# Patient Record
Sex: Male | Born: 1955 | Race: Black or African American | Hispanic: No | Marital: Single | Smoking: Never smoker
Health system: Southern US, Community
[De-identification: ages and names within clinical notes are randomized; demographics above are authoritative.]

## PROBLEM LIST (undated history)

## (undated) DIAGNOSIS — N200 Calculus of kidney: Secondary | ICD-10-CM

## (undated) DIAGNOSIS — N183 Chronic kidney disease, stage 3 unspecified: Secondary | ICD-10-CM

## (undated) DIAGNOSIS — I5030 Unspecified diastolic (congestive) heart failure: Secondary | ICD-10-CM

## (undated) DIAGNOSIS — I10 Essential (primary) hypertension: Secondary | ICD-10-CM

## (undated) HISTORY — DX: Calculus of kidney: N20.0

## (undated) HISTORY — DX: Essential (primary) hypertension: I10

---

## 2017-11-26 ENCOUNTER — Emergency Department
Admission: EM | Admit: 2017-11-26 | Discharge: 2017-11-26 | Disposition: A | Discharge status: Home / Self Care | Payer: BLUE CROSS/BLUE SHIELD | Attending: Emergency Medicine | Admitting: Emergency Medicine

## 2017-11-26 DIAGNOSIS — R040 Epistaxis: Secondary | ICD-10-CM | POA: Insufficient documentation

## 2017-11-26 LAB — CBC AND DIFFERENTIAL
Absolute NRBC: 0 10*3/uL (ref 0.00–0.00)
Basophils Absolute Automated: 0.04 10*3/uL (ref 0.00–0.08)
Basophils Automated: 0.5 %
Eosinophils Absolute Automated: 0.07 10*3/uL (ref 0.00–0.44)
Eosinophils Automated: 0.8 %
Hematocrit: 36.2 % — ABNORMAL LOW (ref 37.6–49.6)
Hgb: 12.4 g/dL — ABNORMAL LOW (ref 12.5–17.1)
Immature Granulocytes Absolute: 0.05 10*3/uL (ref 0.00–0.07)
Immature Granulocytes: 0.6 %
Lymphocytes Absolute Automated: 1.09 10*3/uL (ref 0.42–3.22)
Lymphocytes Automated: 12.5 %
MCH: 29.5 pg (ref 25.1–33.5)
MCHC: 34.3 g/dL (ref 31.5–35.8)
MCV: 86 fL (ref 78.0–96.0)
MPV: 11.2 fL (ref 8.9–12.5)
Monocytes Absolute Automated: 0.57 10*3/uL (ref 0.21–0.85)
Monocytes: 6.5 %
Neutrophils Absolute: 6.93 10*3/uL — ABNORMAL HIGH (ref 1.10–6.33)
Neutrophils: 79.1 %
Nucleated RBC: 0 /100 WBC (ref 0.0–0.0)
Platelets: 160 10*3/uL (ref 142–346)
RBC: 4.21 10*6/uL (ref 4.20–5.90)
RDW: 13 % (ref 11–15)
WBC: 8.75 10*3/uL (ref 3.10–9.50)

## 2017-11-26 LAB — COMPREHENSIVE METABOLIC PANEL
ALT: 30 U/L (ref 0–55)
AST (SGOT): 30 U/L (ref 5–34)
Albumin/Globulin Ratio: 1.4 (ref 0.9–2.2)
Albumin: 3.8 g/dL (ref 3.5–5.0)
Alkaline Phosphatase: 138 U/L — ABNORMAL HIGH (ref 38–106)
Anion Gap: 10 (ref 5.0–15.0)
BUN: 30 mg/dL — ABNORMAL HIGH (ref 9.0–28.0)
Bilirubin, Total: 0.8 mg/dL (ref 0.2–1.2)
CO2: 24 mEq/L (ref 22–29)
Calcium: 9 mg/dL (ref 8.5–10.5)
Chloride: 110 mEq/L (ref 100–111)
Creatinine: 2 mg/dL — ABNORMAL HIGH (ref 0.7–1.3)
Globulin: 2.7 g/dL (ref 2.0–3.6)
Glucose: 174 mg/dL — ABNORMAL HIGH (ref 70–100)
Potassium: 3.5 mEq/L (ref 3.5–5.1)
Protein, Total: 6.5 g/dL (ref 6.0–8.3)
Sodium: 144 mEq/L (ref 136–145)

## 2017-11-26 LAB — GFR: EGFR: 41.5

## 2017-11-26 MED ORDER — LIDOCAINE-EPINEPHRINE 1 %-1:200000 IJ SOLN
30.00 mL | Freq: Once | INTRAMUSCULAR | Status: AC
Start: 2017-11-26 — End: 2017-11-26

## 2017-11-26 MED ORDER — AMOXICILLIN-POT CLAVULANATE 875-125 MG PO TABS
1.00 | ORAL_TABLET | Freq: Two times a day (BID) | ORAL | 0 refills | Status: AC
Start: 2017-11-26 — End: 2017-12-03

## 2017-11-26 MED ORDER — OXYMETAZOLINE HCL 0.05 % NA SOLN
NASAL | Status: DC
Start: 2017-11-26 — End: 2017-11-26
  Administered 2017-11-26: 08:00:00 2 via NASAL
  Filled 2017-11-26: qty 15

## 2017-11-26 MED ORDER — OXYMETAZOLINE HCL 0.05 % NA SOLN
2.00 | Freq: Once | NASAL | Status: DC
Start: 2017-11-26 — End: 2017-11-26

## 2017-11-26 MED ORDER — SODIUM CHLORIDE 0.9 % IV BOLUS
1000.00 mL | Freq: Once | INTRAVENOUS | Status: AC
Start: 2017-11-26 — End: 2017-11-26
  Administered 2017-11-26: 08:00:00 1000 mL via INTRAVENOUS

## 2017-11-26 MED ORDER — LIDOCAINE-EPINEPHRINE 1 %-1:200000 IJ SOLN
INTRAMUSCULAR | Status: AC
Start: 2017-11-26 — End: 2017-11-26
  Administered 2017-11-26: 08:00:00 30 mL
  Filled 2017-11-26: qty 30

## 2017-11-26 NOTE — ED Notes (Signed)
Pt rang out on call bell, c/o feeling faint, spitting up bright red blood with clots. Pt diaphoretic, cool to touch. Dr. Merri Brunette to bedside. IV placed, labs drawn. Pt's mouth suctioned.

## 2017-11-26 NOTE — ED Provider Notes (Signed)
EMERGENCY DEPARTMENT HISTORY AND PHYSICAL EXAM     Physician/Midlevel provider first contact with patient: 11/26/17 0732         Date: 11/26/2017  Patient Name: Spencer Peters    History of Presenting Illness     Chief Complaint   Patient presents with   . Epistaxis       History Provided By: Pt    Chief Complaint: Epistaxis  Duration: This morning  Timing: Persistent  Location: b/l nares  Quality:   Severity: Moderate  Exacerbating Factors: None   Alleviating Factors: None  Associated Symptoms: None   Pertinent Negatives: Taking blood thinners, direct trauma to the nose, recent illnesses    Additional History: Spencer Peters is a 62 y.o. male presenting to the ED with epistaxis to the b/l nares x this morning. This has never happened to pt before. Denies taking blood thinners, direct trama to the nose, or recent illnesses.    PCP: Christa See, MD  Specialist: Unknown       Current Facility-Administered Medications   Medication Dose Route Frequency Provider Last Rate Last Dose   . oxymetazoline (AFRIN) 0.05 % nasal spray 2 spray  2 spray Each Nare Once Rozetta Nunnery, MD   2 spray at 11/26/17 0749   . sodium chloride 0.9 % bolus 1,000 mL  1,000 mL Intravenous Once Rozetta Nunnery, MD 1,000 mL/hr at 11/26/17 0828 1,000 mL at 11/26/17 1610     Current Outpatient Prescriptions   Medication Sig Dispense Refill   . amoxicillin-clavulanate (AUGMENTIN) 875-125 MG per tablet Take 1 tablet by mouth 2 (two) times daily for 7 days 14 tablet 0       Past History     Past Medical History:  History reviewed. No pertinent past medical history.    Past Surgical History:  History reviewed. No pertinent surgical history.    Family History:  History reviewed. No pertinent family history.    Social History:  Social History   Substance Use Topics   . Smoking status: Never Smoker   . Smokeless tobacco: Never Used   . Alcohol use No       Allergies:  No Known Allergies    Review of Systems     Review of Systems    Constitutional: Negative for fever.   HENT: Positive for nosebleeds.    Eyes: Negative for redness.   Respiratory: Negative for choking.    Gastrointestinal: Negative for vomiting.   Endocrine: Negative for heat intolerance.   Musculoskeletal: Negative for back pain.   Allergic/Immunologic:        NKDA   Neurological: Negative for syncope.   Psychiatric/Behavioral: Negative for suicidal ideas.       Physical Exam   BP (!) 220/110 Comment: manual, MD aware  Pulse 82   Temp (!) 96.8 F (36 C) (Oral)   Resp 17   Ht 5\' 8"  (1.727 m)   Wt 89.8 kg   SpO2 97%   BMI 30.11 kg/m     Physical Exam   Constitutional: He is oriented to person, place, and time. He appears well-developed and well-nourished. No distress.   HENT:   Head: Normocephalic and atraumatic.   Frank epistaxis to b/ nares   Eyes: Pupils are equal, round, and reactive to light.   Neck: Normal range of motion. Neck supple.   Cardiovascular: Normal rate, regular rhythm, normal heart sounds and intact distal pulses.    Pulmonary/Chest: Effort normal.   Abdominal: Soft.  Musculoskeletal: Normal range of motion.   Neurological: He is alert and oriented to person, place, and time.   Skin: Skin is warm and dry. He is not diaphoretic.   Nursing note and vitals reviewed.      Diagnostic Study Results     Labs -     Results     Procedure Component Value Units Date/Time    Comprehensive Metabolic Panel (CMP) [[id number]]  (Abnormal) Collected:  11/26/17 0829    Specimen:  Blood Updated:  11/26/17 0855     Glucose 174 (H) mg/dL      BUN 40.9 (H) mg/dL      Creatinine 2.0 (H) mg/dL      Sodium 811 mEq/L      Potassium 3.5 mEq/L      Chloride 110 mEq/L      CO2 24 mEq/L      Calcium 9.0 mg/dL      Protein, Total 6.5 g/dL      Albumin 3.8 g/dL      AST (SGOT) 30 U/L      ALT 30 U/L      Alkaline Phosphatase 138 (H) U/L      Bilirubin, Total 0.8 mg/dL      Globulin 2.7 g/dL      Albumin/Globulin Ratio 1.4     Anion Gap 10.0    GFR [[id number]] Collected:  11/26/17  0829     Updated:  11/26/17 0855     EGFR 41.5    CBC with differential [[id number]]  (Abnormal) Collected:  11/26/17 0830    Specimen:  Blood from Blood Updated:  11/26/17 0837     WBC 8.75 x10 3/uL      Hgb 12.4 (L) g/dL      Hematocrit 46.9 (L) %      Platelets 160 x10 3/uL      RBC 4.21 x10 6/uL      MCV 86.0 fL      MCH 29.5 pg      MCHC 34.3 g/dL      RDW 13 %      MPV 11.2 fL      Neutrophils 79.1 %      Lymphocytes Automated 12.5 %      Monocytes 6.5 %      Eosinophils Automated 0.8 %      Basophils Automated 0.5 %      Immature Granulocyte 0.6 %      Nucleated RBC 0.0 /100 WBC      Neutrophils Absolute 6.93 (H) x10 3/uL      Abs Lymph Automated 1.09 x10 3/uL      Abs Mono Automated 0.57 x10 3/uL      Abs Eos Automated 0.07 x10 3/uL      Absolute Baso Automated 0.04 x10 3/uL      Absolute Immature Granulocyte 0.05 x10 3/uL      Absolute NRBC 0.00 x10 3/uL           Radiologic Studies -   Radiology Results (24 Hour)     ** No results found for the last 24 hours. **      .      Medical Decision Making   I am the first provider for this patient.    I reviewed the vital signs, available nursing notes, past medical history, past surgical history, family history and social history.    Vital Signs-Reviewed the patient's vital signs.     Patient Vitals for the past 12  hrs:   BP Temp Pulse Resp   11/26/17 0943 - 98.9 F (37.2 C) - -   11/26/17 0912 (!) 220/110 - 82 17   11/26/17 0823 - - 91 20   11/26/17 0746 144/76 (!) 96.8 F (36 C) (!) 107 18       Pulse Oximetry Analysis - Normal, 98% on RA    Old Medical Records: Nursing notes.     ED Course:     8:10 AM - Rhinorockets placed b/l nares.    9:16 AM - Updated pt on results. Discussed with pt that the rhinorocket needs to be removed in 24-48 hours by ENT (will provide referral). Will prescribe abx to take until he f/u with ENT. Advised pt on return precautions.     Provider Notes: Pt presented with acute epistaxis from both nares this morning. No known  coagulopathy. Initially packed with cotton soaked in afrin and lidocaine, but bleeding persisted.  Clots evacuated and rhino rockets placed b/l.  Bleeding well controlled with those in place.  Will place on augmentin for prophylaxsis and send patient to ENT follow up as outpatient in 1-2 days.  Pt in agreement with this plan.     Procedures:    -----------------  PROCEDURE: EPISTAXIS MANAGEMENT  -----------------     Performed by the emergency provider  Consent: Informed consent was obtained after discussion of the risks, benefits, and alternatives to the procedure.   Indication: Nasal bleeding control  Location:  bilaterally naris  Medication: Afrin  Bleeding Source: anterior venous plexus  Cautery: none  Packing: Rhinorocket to b/l nares  Post-procedure: Good hemostasis.  The patient was observed following procedure and no repeat episode of bleeding was noted. Patient tolerated the procedure well with no immediate complications.    Diagnosis     Clinical Impression:   1. Epistaxis        Treatment Plan:   ED Disposition     ED Disposition Condition Date/Time Comment    Discharge  Mon Nov 26, 2017  9:22 AM Spencer Peters discharge to home/self care.    Condition at disposition: Stable        _______________________________    Attestations:   This note is prepared by Marcille Blanco, acting as Scribe for Monica Becton, MD.    Monica Becton, MD:  The scribe's documentation has been prepared under my direction and personally reviewed by me in its entirety.  I confirm that the note above accurately reflects all work, treatment, procedures, and medical decision making performed by me.    _______________________________             Rozetta Nunnery, MD  11/26/17 [phone]

## 2017-11-26 NOTE — Discharge Instructions (Signed)
Epistaxis    You have been seen for Epistaxis (bloody nose).    Epistaxis in the medical term for a bloody nose. Epistaxis is a common condition. While it may be scary, it is not often serious. The skin in the front of the nose is very fragile. In children, it is most often damaged by direct trauma (nose-picking) or when the skin is very dry or irritated (like in dry environments on when you have a cold).    Bleeding often starts at the end of the nose. Therefore, pinching the entire nose for 15 minutes often helps stop the bleeding. When the bleeding area can be found, the doctor may use a chemical to stop the bleeding. This chemical is called Silver Nitrate. Other chemicals are sometimes used to stop the bleeding. These chemicals cause the oozing blood to clot. You may have had one or both of these treatments today. If chemical treatment doesn't stop the bleeding, packing may be placed into the nose. Packing can be uncomfortable. However, it often really works at stopping a bloody nose.    Sometimes bleeding starts from a blood vessel way in the back of the nose or in the throat. This type of bleeding is hard to control. Nasal packing by an Ear, Nose and Throat specialist is often needed.    Putting ice on the forehead or back of the neck is not effective. It WILL NOT stop the bleeding. Instead, press or squeeze the nose directly. If bleeding doesn't stop in 15 minutes, come back here or go to the nearest Emergency Department.    Nasal packing was inserted into your nose. This was to control the bleeding. This packing will need to be removed:   In 48 hours.    Do not use aspirin or non-steroidal anti-inflammatory medicines like ibuprofen (Advil or Motrin), naproxen (Naprosyn or Aleve), etc. Also stay away from alcohol. These make it hard for your blood to clot.    Avoid blowing your nose, sneezing and straining for the next few days.    YOU SHOULD SEEK MEDICAL ATTENTION IMMEDIATELY, EITHER HERE OR AT  THE NEAREST EMERGENCY DEPARTMENT, IF ANY OF THE FOLLOWING OCCURS:   Bleeding doesn't stop with direct pressure.   Bleeding down the back of the throat.   The packing gets out of place.

## 2017-11-28 ENCOUNTER — Ambulatory Visit (INDEPENDENT_AMBULATORY_CARE_PROVIDER_SITE_OTHER): Payer: BLUE CROSS/BLUE SHIELD | Admitting: Student in an Organized Health Care Education/Training Program

## 2018-01-17 ENCOUNTER — Emergency Department: Payer: BLUE CROSS/BLUE SHIELD

## 2018-01-17 ENCOUNTER — Emergency Department
Admission: EM | Admit: 2018-01-17 | Discharge: 2018-01-17 | Disposition: A | Discharge status: Home / Self Care | Payer: BLUE CROSS/BLUE SHIELD | Attending: Emergency Medicine | Admitting: Emergency Medicine

## 2018-01-17 DIAGNOSIS — K5730 Diverticulosis of large intestine without perforation or abscess without bleeding: Secondary | ICD-10-CM | POA: Insufficient documentation

## 2018-01-17 DIAGNOSIS — I129 Hypertensive chronic kidney disease with stage 1 through stage 4 chronic kidney disease, or unspecified chronic kidney disease: Secondary | ICD-10-CM | POA: Insufficient documentation

## 2018-01-17 DIAGNOSIS — Z87442 Personal history of urinary calculi: Secondary | ICD-10-CM | POA: Insufficient documentation

## 2018-01-17 DIAGNOSIS — N183 Chronic kidney disease, stage 3 unspecified: Secondary | ICD-10-CM

## 2018-01-17 DIAGNOSIS — M545 Low back pain, unspecified: Secondary | ICD-10-CM

## 2018-01-17 LAB — BASIC METABOLIC PANEL
Anion Gap: 11 (ref 5.0–15.0)
BUN: 22 mg/dL (ref 9.0–28.0)
CO2: 25 mEq/L (ref 22–29)
Calcium: 9.9 mg/dL (ref 8.5–10.5)
Chloride: 101 mEq/L (ref 100–111)
Creatinine: 2 mg/dL — ABNORMAL HIGH (ref 0.7–1.3)
Glucose: 101 mg/dL — ABNORMAL HIGH (ref 70–100)
Potassium: 4.6 mEq/L (ref 3.5–5.1)
Sodium: 137 mEq/L (ref 136–145)

## 2018-01-17 LAB — URINALYSIS, REFLEX TO MICROSCOPIC EXAM IF INDICATED
Bilirubin, UA: NEGATIVE
Glucose, UA: NEGATIVE
Ketones UA: NEGATIVE
Leukocyte Esterase, UA: NEGATIVE
Nitrite, UA: NEGATIVE
Protein, UR: NEGATIVE
Specific Gravity UA: 1.01 (ref 1.001–1.035)
Urine pH: 7 (ref 5.0–8.0)
Urobilinogen, UA: 0.2 mg/dL

## 2018-01-17 LAB — GFR: EGFR: 41.5

## 2018-01-17 MED ORDER — ACETAMINOPHEN 500 MG PO TABS
1000.00 mg | ORAL_TABLET | Freq: Once | ORAL | Status: AC
Start: 2018-01-17 — End: 2018-01-17
  Administered 2018-01-17: 14:00:00 1000 mg via ORAL
  Filled 2018-01-17: qty 2

## 2018-01-17 MED ORDER — LIDOCAINE 5 % EX PTCH
1.00 | MEDICATED_PATCH | Freq: Two times a day (BID) | CUTANEOUS | 0 refills | Status: AC | PRN
Start: 2018-01-17 — End: ?

## 2018-01-17 NOTE — ED Triage Notes (Signed)
Pt had an [place] about a month ago and was told he had a kidney stone in his right side.     Pt with pain to right flank still.     Pt denies blood in urine, no frequency or burning

## 2018-01-17 NOTE — Discharge Instructions (Signed)
Back Pain, Lumbar NOS    You have been seen for low back pain.     This area is also called the lumbar spine.    Pain in the low back is very a common problem. This pain is usually due to overuse of the muscles, tension or a strain of the muscles. There can also be damage to the discs that cushion the bones in the spine. This can cause irritation of the nerves that exit the spinal canal in the low back.    Your doctor did not find any pain over the bones in your back (even though you might have pain in the back muscles). This means it is very unlikely that you have a broken bone in your back. Your doctor did not think it was necessary to take an x-ray.    The doctor still does not know the exact cause of your pain. Your problem does not seem to be from a dangerous cause. It is OK for you to go home today.    Some things you can try to help your back feel better are:   Apply a warm damp washcloth to the back for 20 minutes at a time, at least 4 times per day. This will reduce your pain. Massaging your back might also help.   Have someone massage the sore parts of your back.   Don't do any heavy lifting or bending. You can go back to normal daily activities if they don't make the pain worse.   Use the over-the-counter anti-inflammatory medication ibuprofen (also known as Advil or Motrin) as directed on the package to help with pain and inflammation.    It is normal for the pain to last for the next few days.     Call your doctor or go to the nearest Emergency Department if you your pain does not improve or your pain is bad enough to seriously limit your normal activities.    YOU SHOULD SEEK MEDICAL ATTENTION IMMEDIATELY, EITHER HERE OR AT THE NEAREST EMERGENCY DEPARTMENT, IF ANY OF THE FOLLOWING OCCURS:   You think the pain is coming from somewhere other than your back. This can include pelvic pain. This can be from infections in the pelvis or lower belly.   You have abdominal (belly) pain that goes  through to your back.   Your legs tingle or get numb (lose feeling).   Your legs are weak.   You have fever (temperature higher than 100.63F / 38C) along with back pain.   Your back pain is getting worse.   You lose control of your bladder or bowels. If this were to happen, it may cause you to wet or soil yourself.   You have problems urinating (peeing).   Your symptoms get worse or you have new symptoms or concerns.    If you can't follow up with your doctor, or if at any time you feel you need to be rechecked or seen again, come back here or go to the nearest emergency department.       Pain control:    1000mg  tylenol every 6 hours as needed for pain.  Lidoderm patches, can place one for 12 hours at location of pain.

## 2018-01-17 NOTE — ED Provider Notes (Signed)
EMERGENCY DEPARTMENT HISTORY AND PHYSICAL EXAM     Physician/Midlevel provider first contact with patient: 01/17/18 1257         Date: 01/17/2018  Patient Name: Spencer Peters  Attending Physician: Laren Boom, MD  Advanced Practice Provider: Arnoldo Morale, PA-C    History of Presenting Illness       History Provided By: Patient  Interpreter: None    Chief Complaint:  Chief Complaint   Patient presents with   . Flank Pain       HPI: Spencer Peters is a 62 y.o. male PMHx HTN presenting to the ED with 1 month persistent left lower back pain worse today. Intermittent, sharp, does not radiate. Worse when bending over.  He was diagnosed with a kidney stone by ultrasound a month ago, but is concerned they have not passed by now.  He has increased hydration and increased working out and attempt to lose weight to get off his blood pressure medicine.  No history of diabetes or renal impairment however elevated creatinine at ED visit 2 months ago prompted work-up, PCP referred to nephrologist who he has not seen yet. Denies dysuria, urgency, frequency, LE paresthesias/numbness. No pain meds PTA.    PCP: [name], MD  SPECIALISTS:    No current facility-administered medications for this encounter.      Current Outpatient Prescriptions   Medication Sig Dispense Refill   . amLODIPine (NORVASC) 10 MG tablet amlodipine 10 mg tablet   Take 1 tablet every day by oral route.     . labetalol (NORMODYNE) 100 MG tablet labetalol 100 mg tablet   Take 1 tablet twice a day by oral route.     Marland Kitchen spironolactone (ALDACTONE) 50 MG tablet spironolactone 50 mg tablet   Take 1 tablet every day by oral route.     . lidocaine (LIDODERM) 5 % Place 1 patch onto the skin 2 (two) times daily as needed (pain) Remove & Discard patch within 12 hours or as directed by MD 30 patch 0       Past History     Past Medical History:  Past Medical History:   Diagnosis Date   . Calculus of kidney    . Hypertension        Past Surgical  History:  History reviewed. No pertinent surgical history.    Family History:  History reviewed. No pertinent family history.    Social History:  Social History     Social History   . Marital status: Single     Spouse name: N/A   . Number of children: N/A   . Years of education: N/A     Social History Main Topics   . Smoking status: Never Smoker   . Smokeless tobacco: Never Used   . Alcohol use No   . Drug use: No   . Sexual activity: Not Asked     Other Topics Concern   . None     Social History Narrative   . None       Allergies:  No Known Allergies    Review of Systems     Review of Systems   Constitutional: Negative for chills and fever.   Respiratory: Negative for cough and shortness of breath.    Cardiovascular: Negative for chest pain and palpitations.   Gastrointestinal: Negative for abdominal pain, nausea and vomiting.   Genitourinary: Positive for flank pain. Negative for dysuria, frequency and urgency.   Musculoskeletal: Positive for back pain.  Skin: Negative for itching and rash.   All other systems reviewed and are negative.      Physical Exam     Vitals:    01/17/18 1221   BP: 143/71   Pulse: 65   Resp: 18   Temp: 98.9 F (37.2 C)   TempSrc: Oral   SpO2: 99%   Weight: 88.5 kg   Height: 5\' 6"  (1.676 m)       Physical Exam   Constitutional: He is oriented to person, place, and time. He appears well-developed and well-nourished. No distress.   No acute distress.    HENT:   Head: Normocephalic and atraumatic.   Right Ear: External ear normal.   Left Ear: External ear normal.   Nose: Nose normal.   Eyes: Pupils are equal, round, and reactive to light. EOM are normal.   Neck: Normal range of motion. Neck supple. No tracheal deviation present.   Cardiovascular: Normal rate, regular rhythm, normal heart sounds and intact distal pulses.    Pulmonary/Chest: Effort normal and breath sounds normal. No respiratory distress.   Abdominal: Soft. He exhibits no distension. There is no tenderness.   Musculoskeletal:  Normal range of motion. He exhibits no deformity.        Back:    Neurological: He is alert and oriented to person, place, and time. Coordination normal.   Skin: Skin is warm and dry. Capillary refill takes less than 2 seconds.   Nursing note and vitals reviewed.      Diagnostic Study Results     Labs -     Results     Procedure Component Value Units Date/Time    GFR [[id number]] Collected:  01/17/18 1350     Updated:  01/17/18 1420     EGFR 41.5    Basic Metabolic Panel [[id number]]  (Abnormal) Collected:  01/17/18 1350    Specimen:  Blood Updated:  01/17/18 1420     Glucose 101 (H) mg/dL      BUN 69.6 mg/dL      Creatinine 2.0 (H) mg/dL      Calcium 9.9 mg/dL      Sodium 295 mEq/L      Potassium 4.6 mEq/L      Chloride 101 mEq/L      CO2 25 mEq/L      Anion Gap 11.0    UA with reflex to micro (pts  3 + yrs) [[id number]]  (Abnormal) Collected:  01/17/18 1253    Specimen:  Urine Updated:  01/17/18 1308     Urine Type Clean Catch     Color, UA Yellow     Clarity, UA Clear     Specific Gravity UA 1.010     Urine pH 7.0     Leukocyte Esterase, UA Negative     Nitrite, UA Negative     Protein, UR Negative     Glucose, UA Negative     Ketones UA Negative     Urobilinogen, UA 0.2 mg/dL      Bilirubin, UA Negative     Blood, UA Trace (A)     RBC, UA 0-2 /hpf      WBC, UA 0-5 /hpf           Radiologic Studies -   Radiology Results (24 Hour)     Procedure Component Value Units Date/Time    CT Abd/Pelvis without Contrast [[id number]] Collected:  01/17/18 1343    Order Status:  Completed Updated:  01/17/18 1354  Narrative:       INDICATION: FLANK PAIN, STONE KNOWN OR SUSPECTED    TECHNIQUE: Noncontrast CT acquisition was performed from the lung bases  to the pelvic floor.  Coronal and sagittal reformats performed. A  combination of automatic exposure control and adjustment  of the mA  and/or kV according to patient size was utilized.    COMPARISON: None    FINDINGS: The lung bases are clear.    Assessment of the viscera is  limited without IV contrast.  There is a  nodular contour of the left hepatic lobe inferiorly. Liver is otherwise  unremarkable for noncontrast technique The unenhanced spleen,  gallbladder, pancreas, and adrenal glands are unremarkable.  Kidneys are  unremarkable.  There is no evidence of hydronephrosis, hydroureter,  renal, or ureteral stones.     Unopacified loops of large and small bowel are nondilated. Normal  appendix identified. There is mild diffuse colonic diverticulosis  without evidence of diverticulitis. The unenhanced bladder, prostate and  seminal vesicles are unremarkable.  There are no suspicious lytic or  blastic lesions.  There is multilevel facet arthropathy and mild  discogenic disease present within the lumbar spine. 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      Impression:            1. No evidence of obstructive uropathy or other acute intra-abdominal or  pelvic process on this noncontrast exam. If there is continued clinical  concern for an acute abnormality, follow-up exam with oral and IV  contrast should be considered.   2. Mild colonic diverticulosis.  3. Slightly nodular liver contour, nonspecific but can be seen with  early cirrhosis.  4. Other incidental findings as discussed above.    Gustavus Messing, MD   01/17/2018 1:50 PM      .    Medical Decision Making   I am the first provider for this patient.    I reviewed the vital signs, available nursing notes, past medical history, past surgical history, family history and social history.    Vital Signs-Reviewed the patient's vital signs.     Patient Vitals for the past 12 hrs:   BP Temp Pulse Resp   01/17/18 1221 143/71 98.9 F (37.2 C) 65 18       Pulse Oximetry Analysis - Normal SpO2: 99 % on RA    Old Medical Records:   11/26/17 - BUN/Cr 30/2.0 GFR 41.5    ED Course:       Provider Notes:   62 year old male history of hypertension presents with left lower back pain with musculoskeletal origin.  Tender to palpation however  does not reproduce pain.  Worse when bending over or moving.  Patient was concerned for persistent kidney stone, however CT abdomen pelvis negative for nephrolithiasis.  Mechanism consistent with increased physical activity, discussed treatment plan for muscular lower back pain.  Improved with Tylenol in ED, avoid NSAIDs.  Kidney function consistent with test 2 months ago despite increase hydration, no new medications, likely chronic.  Agree with follow-up with nephrologist.    Diagnosis     Clinical Impression:   1. Left-sided low back pain without sciatica, unspecified chronicity    2. CKD (chronic kidney disease) stage 3, GFR 30-59 ml/min        Treatment Plan:   ED Disposition     ED Disposition Condition Date/Time Comment    Discharge  Thu Jan 17, 2018  2:27 PM Spencer Peters discharge to home/self care.  Condition at disposition: Stable            _______________________________    CHART OWNERSHIP: I, Evone Arseneau Buck Mam, PA-C, am the primary clinician of record.  _______________________________     Arnoldo Morale, PA  01/17/18 2120       Laren Boom, MD  01/20/18 [phone]

## 2020-12-14 ENCOUNTER — Emergency Department (HOSPITAL_COMMUNITY): Payer: Federal, State, Local not specified - PPO

## 2020-12-14 ENCOUNTER — Inpatient Hospital Stay (HOSPITAL_COMMUNITY)
Admission: EM | Admit: 2020-12-14 | Discharge: 2020-12-17 | DRG: 065 | Disposition: A | Discharge status: Home / Self Care | Payer: Federal, State, Local not specified - PPO | Attending: Family Medicine | Admitting: Family Medicine

## 2020-12-14 ENCOUNTER — Encounter (HOSPITAL_COMMUNITY): Payer: Self-pay

## 2020-12-14 ENCOUNTER — Inpatient Hospital Stay (HOSPITAL_COMMUNITY): Payer: Federal, State, Local not specified - PPO

## 2020-12-14 DIAGNOSIS — R29810 Facial weakness: Secondary | ICD-10-CM | POA: Diagnosis present

## 2020-12-14 DIAGNOSIS — I161 Hypertensive emergency: Secondary | ICD-10-CM | POA: Diagnosis present

## 2020-12-14 DIAGNOSIS — Z91128 Patient's intentional underdosing of medication regimen for other reason: Secondary | ICD-10-CM | POA: Diagnosis not present

## 2020-12-14 DIAGNOSIS — R29703 NIHSS score 3: Secondary | ICD-10-CM | POA: Diagnosis present

## 2020-12-14 DIAGNOSIS — N183 Chronic kidney disease, stage 3 unspecified: Secondary | ICD-10-CM | POA: Diagnosis present

## 2020-12-14 DIAGNOSIS — T461X6A Underdosing of calcium-channel blockers, initial encounter: Secondary | ICD-10-CM | POA: Diagnosis present

## 2020-12-14 DIAGNOSIS — Z20822 Contact with and (suspected) exposure to covid-19: Secondary | ICD-10-CM | POA: Diagnosis present

## 2020-12-14 DIAGNOSIS — R471 Dysarthria and anarthria: Secondary | ICD-10-CM | POA: Diagnosis present

## 2020-12-14 DIAGNOSIS — I10 Essential (primary) hypertension: Secondary | ICD-10-CM | POA: Diagnosis not present

## 2020-12-14 DIAGNOSIS — E669 Obesity, unspecified: Secondary | ICD-10-CM | POA: Diagnosis present

## 2020-12-14 DIAGNOSIS — I639 Cerebral infarction, unspecified: Secondary | ICD-10-CM | POA: Diagnosis present

## 2020-12-14 DIAGNOSIS — N1832 Chronic kidney disease, stage 3b: Secondary | ICD-10-CM | POA: Diagnosis present

## 2020-12-14 DIAGNOSIS — I5032 Chronic diastolic (congestive) heart failure: Secondary | ICD-10-CM | POA: Diagnosis present

## 2020-12-14 DIAGNOSIS — I130 Hypertensive heart and chronic kidney disease with heart failure and stage 1 through stage 4 chronic kidney disease, or unspecified chronic kidney disease: Secondary | ICD-10-CM | POA: Diagnosis present

## 2020-12-14 DIAGNOSIS — E785 Hyperlipidemia, unspecified: Secondary | ICD-10-CM | POA: Diagnosis present

## 2020-12-14 DIAGNOSIS — G8194 Hemiplegia, unspecified affecting left nondominant side: Secondary | ICD-10-CM | POA: Diagnosis present

## 2020-12-14 DIAGNOSIS — I6381 Other cerebral infarction due to occlusion or stenosis of small artery: Secondary | ICD-10-CM | POA: Diagnosis not present

## 2020-12-14 DIAGNOSIS — I6389 Other cerebral infarction: Secondary | ICD-10-CM | POA: Diagnosis not present

## 2020-12-14 DIAGNOSIS — Z8249 Family history of ischemic heart disease and other diseases of the circulatory system: Secondary | ICD-10-CM

## 2020-12-14 HISTORY — DX: Chronic kidney disease, stage 3 unspecified: N18.30

## 2020-12-14 HISTORY — DX: Unspecified diastolic (congestive) heart failure: I50.30

## 2020-12-14 HISTORY — DX: Essential (primary) hypertension: I10

## 2020-12-14 HISTORY — DX: Calculus of kidney: N20.0

## 2020-12-14 LAB — URINALYSIS, ROUTINE W REFLEX MICROSCOPIC
Bacteria, UA: NONE SEEN
Bilirubin Urine: NEGATIVE
Glucose, UA: NEGATIVE mg/dL
Ketones, ur: NEGATIVE mg/dL
Leukocytes,Ua: NEGATIVE
Nitrite: NEGATIVE
Protein, ur: 30 mg/dL — AB
Specific Gravity, Urine: 1.011 (ref 1.005–1.030)
pH: 7 (ref 5.0–8.0)

## 2020-12-14 LAB — BASIC METABOLIC PANEL
Anion gap: 8 (ref 5–15)
BUN: 17 mg/dL (ref 8–23)
CO2: 24 mmol/L (ref 22–32)
Calcium: 9.2 mg/dL (ref 8.9–10.3)
Chloride: 104 mmol/L (ref 98–111)
Creatinine, Ser: 1.77 mg/dL — ABNORMAL HIGH (ref 0.61–1.24)
GFR, Estimated: 43 mL/min — ABNORMAL LOW (ref >60)
Glucose, Bld: 112 mg/dL — ABNORMAL HIGH (ref 70–99)
Potassium: 3.5 mmol/L (ref 3.5–5.1)
Sodium: 136 mmol/L (ref 135–145)

## 2020-12-14 LAB — CBC WITH DIFFERENTIAL/PLATELET
Abs Immature Granulocytes: 0.01 10*3/uL (ref 0.00–0.07)
Basophils Absolute: 0.1 10*3/uL (ref 0.0–0.1)
Basophils Relative: 1 %
Eosinophils Absolute: 0.1 10*3/uL (ref 0.0–0.5)
Eosinophils Relative: 1 %
HCT: 44.5 % (ref 39.0–52.0)
Hemoglobin: 14.8 g/dL (ref 13.0–17.0)
Immature Granulocytes: 0 %
Lymphocytes Relative: 20 %
Lymphs Abs: 1.2 10*3/uL (ref 0.7–4.0)
MCH: 29 pg (ref 26.0–34.0)
MCHC: 33.3 g/dL (ref 30.0–36.0)
MCV: 87.1 fL (ref 80.0–100.0)
Monocytes Absolute: 0.5 10*3/uL (ref 0.1–1.0)
Monocytes Relative: 9 %
Neutro Abs: 4.2 10*3/uL (ref 1.7–7.7)
Neutrophils Relative %: 69 %
Platelets: 192 10*3/uL (ref 150–400)
RBC: 5.11 MIL/uL (ref 4.22–5.81)
RDW: 13.2 % (ref 11.5–15.5)
WBC: 6.1 10*3/uL (ref 4.0–10.5)
nRBC: 0 % (ref 0.0–0.2)

## 2020-12-14 LAB — HEPATIC FUNCTION PANEL
ALT: 25 U/L (ref 0–44)
AST: 32 U/L (ref 15–41)
Albumin: 4.1 g/dL (ref 3.5–5.0)
Alkaline Phosphatase: 124 U/L (ref 38–126)
Bilirubin, Direct: 0.1 mg/dL (ref 0.0–0.2)
Indirect Bilirubin: 0.8 mg/dL (ref 0.3–0.9)
Total Bilirubin: 0.9 mg/dL (ref 0.3–1.2)
Total Protein: 7.4 g/dL (ref 6.5–8.1)

## 2020-12-14 LAB — TROPONIN I (HIGH SENSITIVITY)
Troponin I (High Sensitivity): 11 ng/L (ref <18)
Troponin I (High Sensitivity): 14 ng/L (ref <18)

## 2020-12-14 IMAGING — CT CT HEAD W/O CM
3 series · 15 of 47 positions shown, 18 images · non-contrast
Comparison: None.

CLINICAL DATA: Weakness hypertension

EXAM:
CT HEAD WITHOUT CONTRAST
TECHNIQUE: Contiguous axial images were obtained from the base of the skull
through the vertex without intravenous contrast.

[Series 3: head 5.0 h30s · axial · 0.48mm/px · z∈[-136,+14]mm · 9 of 36 slices shown, 12 images]
[im 3/36  brain]
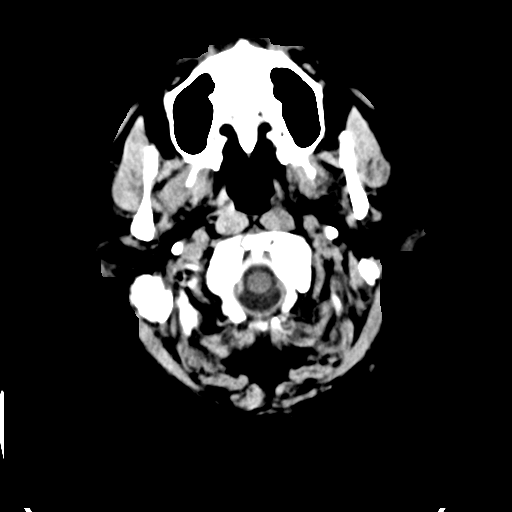
[im 3/36  bone]
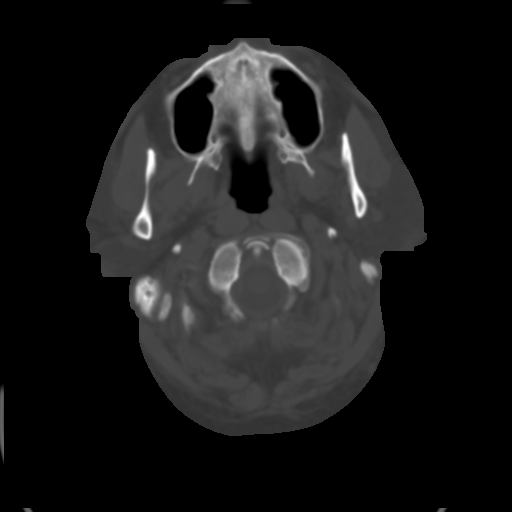
[im 7/36  brain]
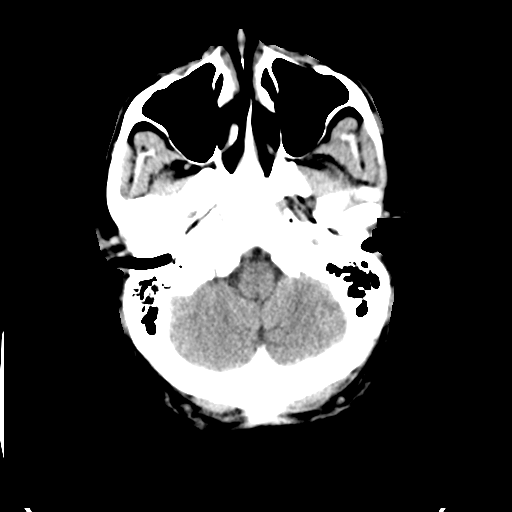
[im 10/36  brain]
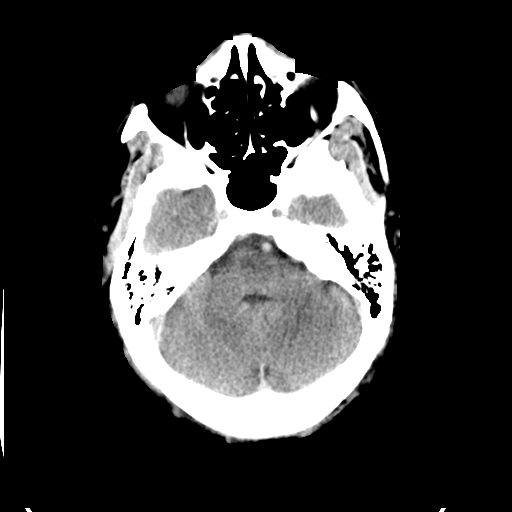
[im 14/36  brain]
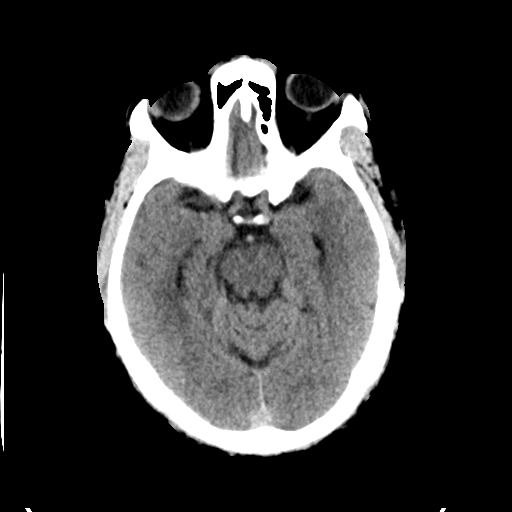
[im 19/36  brain]
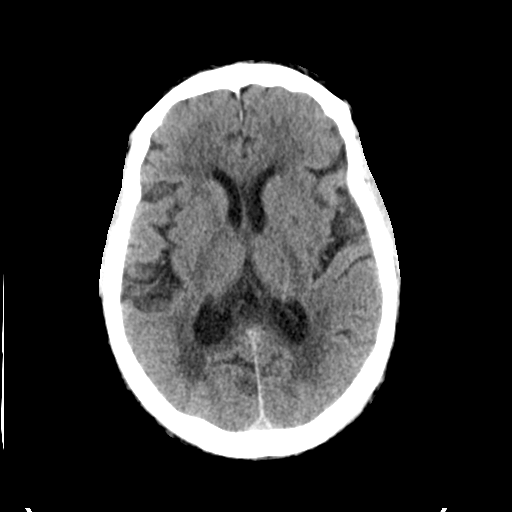
[im 19/36  bone]
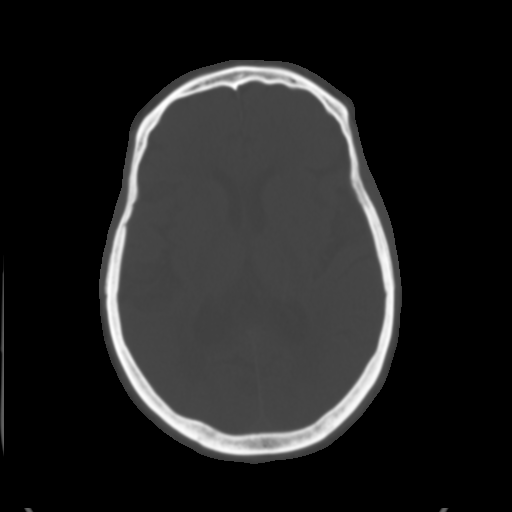
[im 22/36  brain]
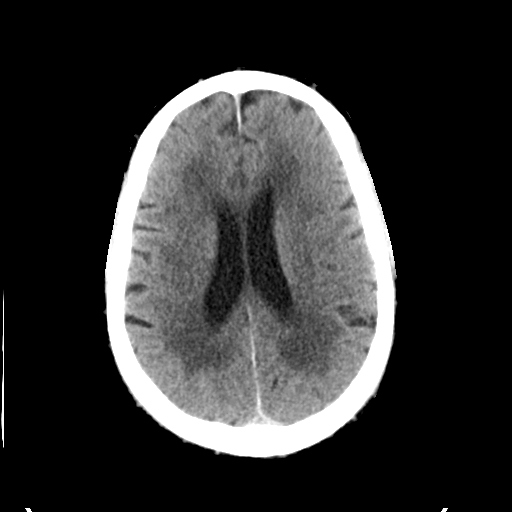
[im 26/36  brain]
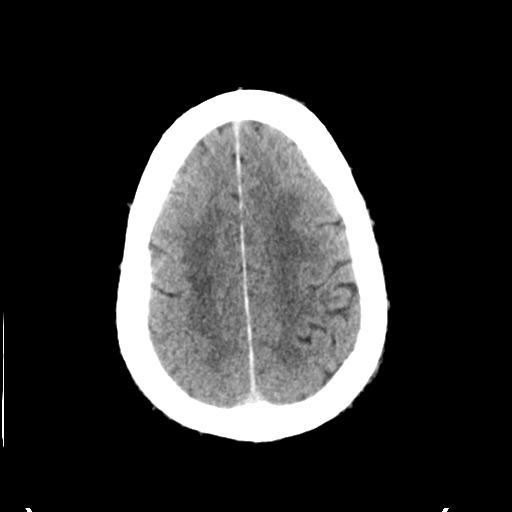
[im 29/36  brain]
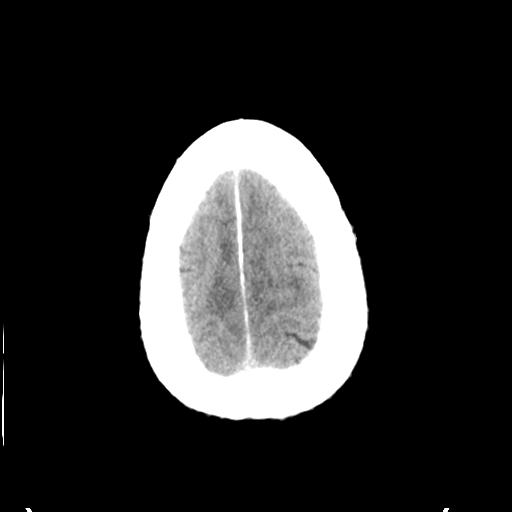
[im 33/36  brain]
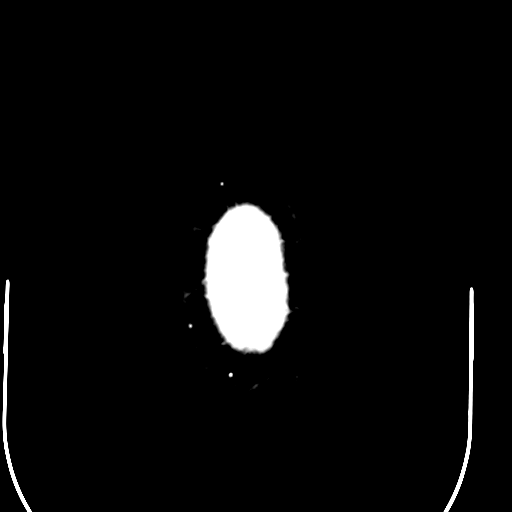
[im 33/36  bone]
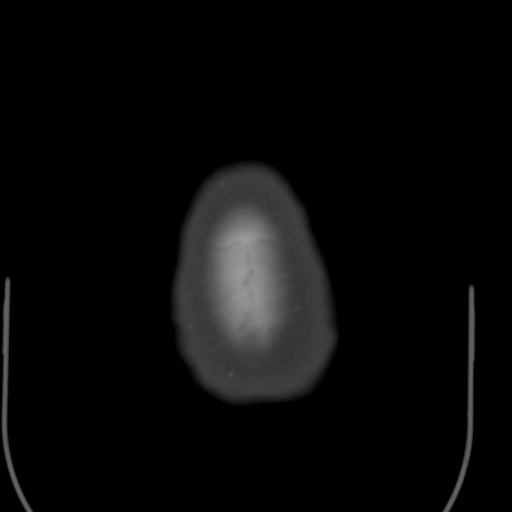

[Series 5: head 3.0 mpr cor · coronal · 0.35mm/px · 3 of 77 slices shown]
[im 26/77  brain]
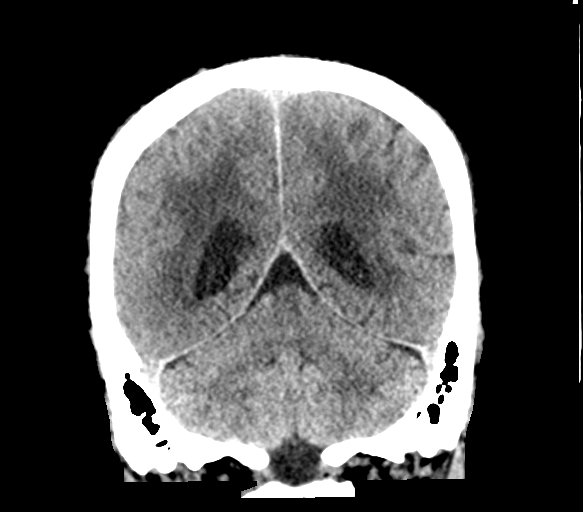
[im 34/77  brain]
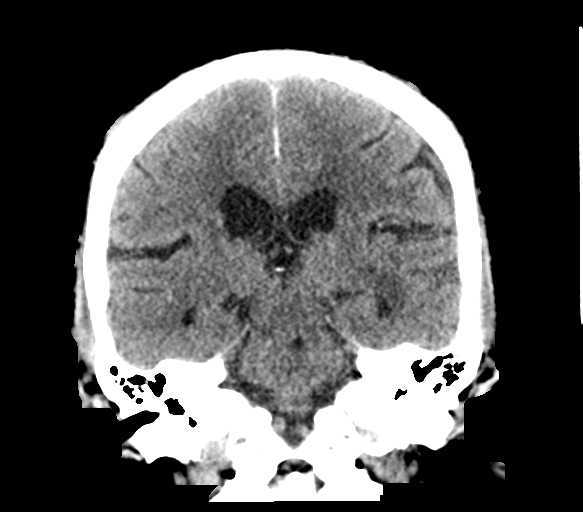
[im 43/77  brain]
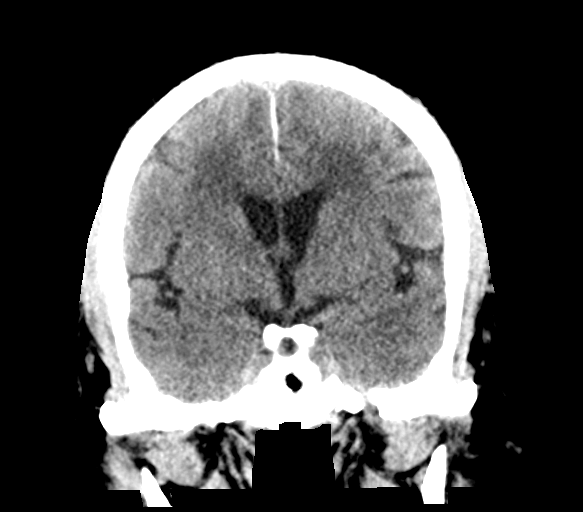

[Series 6: head 3.0 mpr sag · sagittal · 0.35mm/px · 3 of 69 slices shown]
[im 23/69  brain]
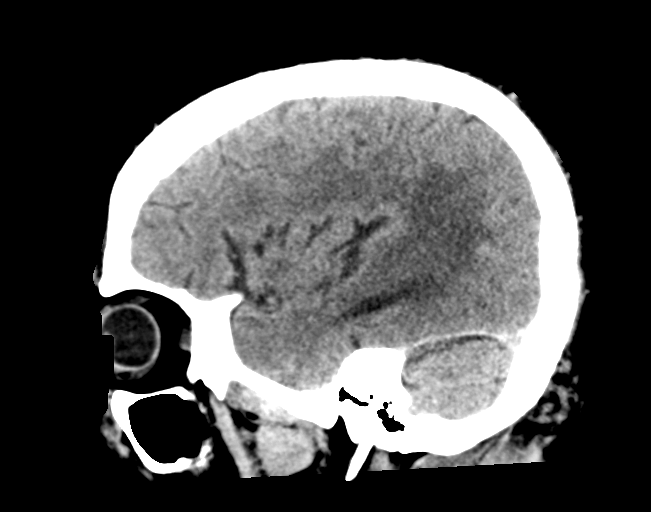
[im 35/69  brain]
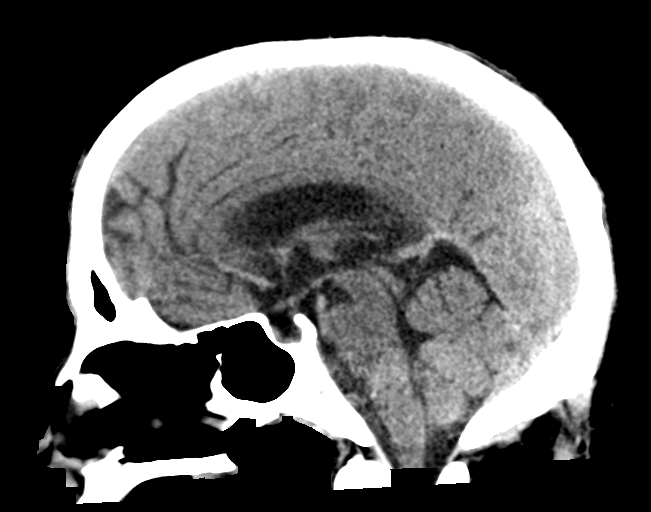
[im 46/69  brain]
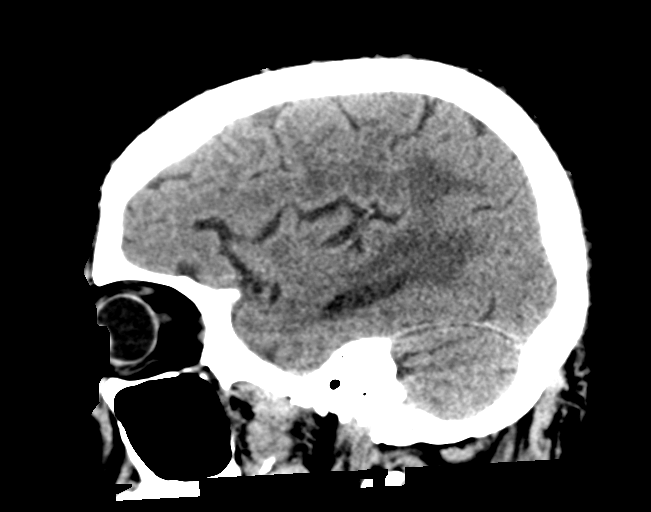

[15 of 47 positions shown; findings below may reference images not displayed]

FINDINGS: Brain: No acute territorial infarction, hemorrhage or intracranial
mass. Advanced hypodensity in the white matter consistent with
chronic small vessel ischemic change. Age indeterminate lacunar
infarcts within the left basal ganglia/white matter, right thalamus
and pons. Nonenlarged ventricles

Vascular: No hyperdense vessels.  No unexpected calcification.

Skull: Normal. Negative for fracture or focal lesion.

Sinuses/Orbits: No acute finding.

Other: None
IMPRESSION: 1. Advanced chronic small vessel ischemic change of white matter.
Age indeterminate lacunar infarcts within the left basal ganglia,
left white matter, right thalamus and pons.
2. Negative for hemorrhage or intracranial mass

## 2020-12-14 IMAGING — MR MR MRA HEAD W/O CM
3 series · 20 of 48 positions shown · non-contrast
Comparison: None.

CLINICAL DATA: Stroke-like symptoms

EXAM:
MRA HEAD WITHOUT CONTRAST
TECHNIQUE: Angiographic images of the Circle of Willis were obtained using MRA
technique without intravenous contrast.

[Series 2: ax (id) · axial · 1.0mm · 0.43mm/px · z∈[-55,+28]mm · 18 of 176 slices shown]
[im 1/176]
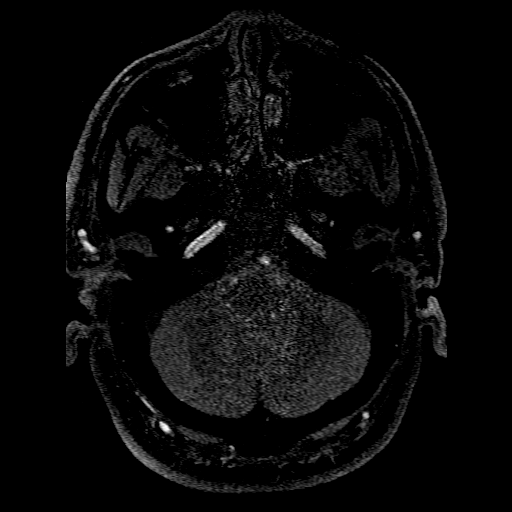
[im 4/176]
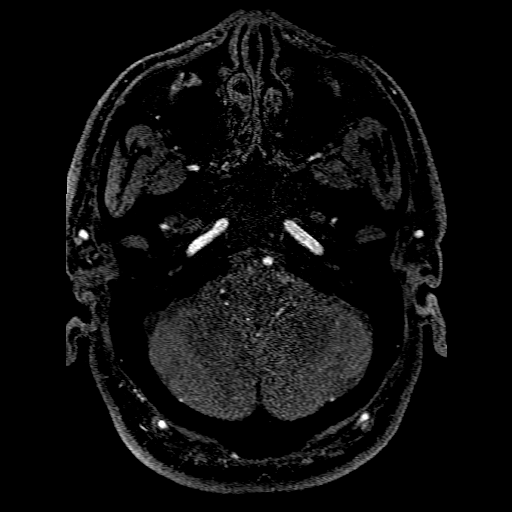
[im 8/176]
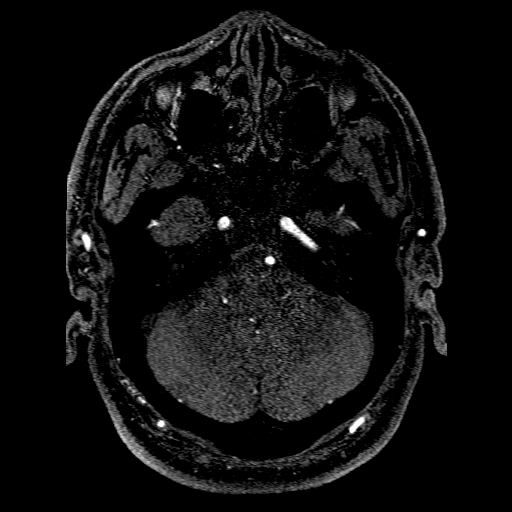
[im 12/176]
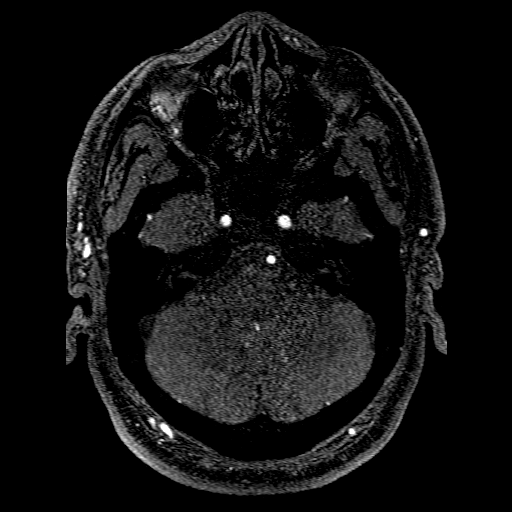
[im 16/176]
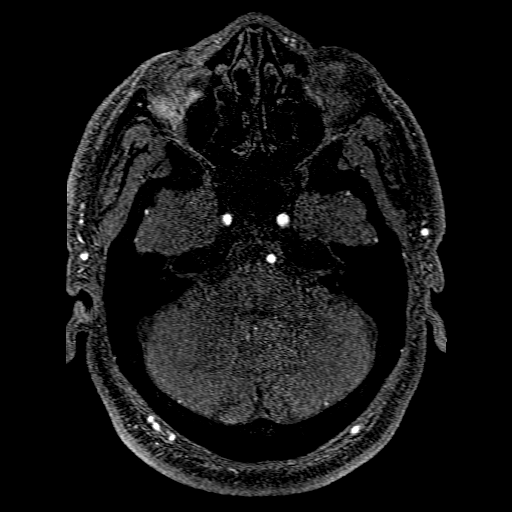
[im 20/176]
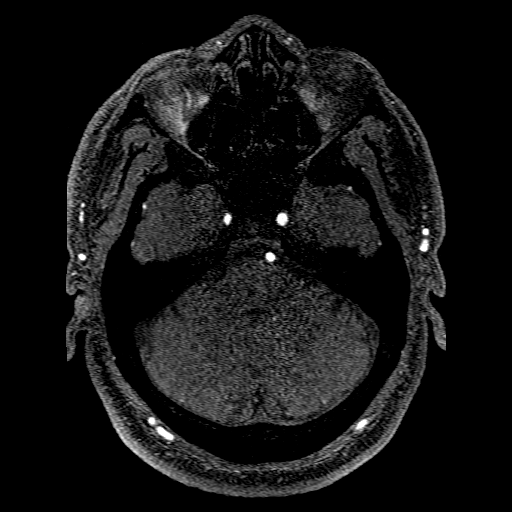
[im 24/176]
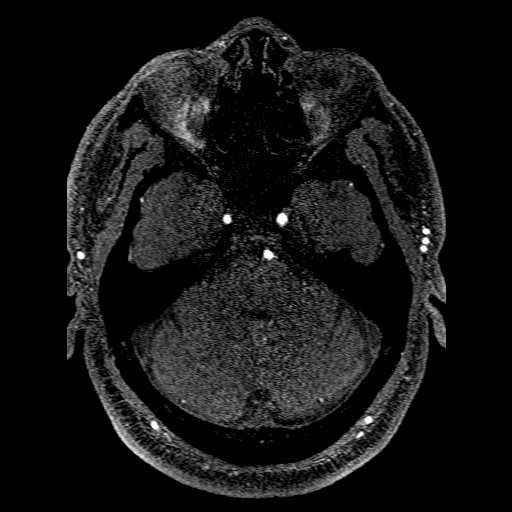
[im 28/176]
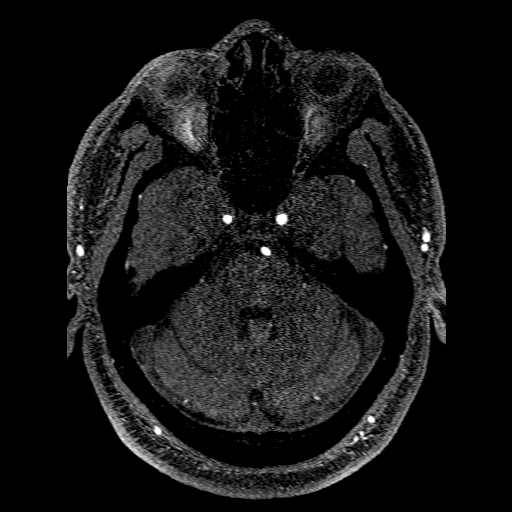
[im 32/176]
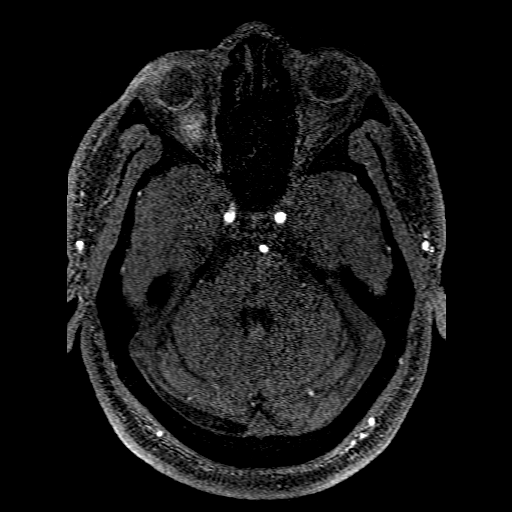
[im 36/176]
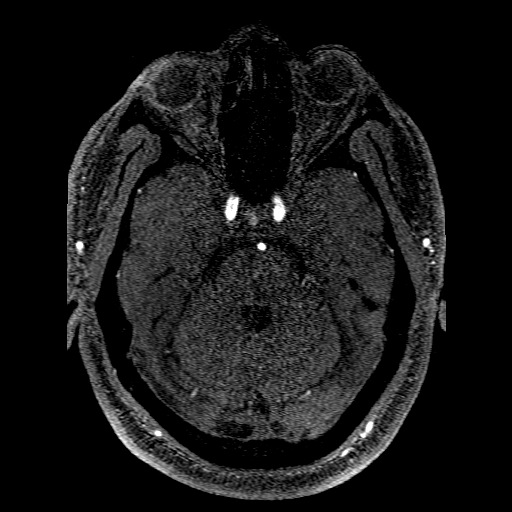
[im 55/176]
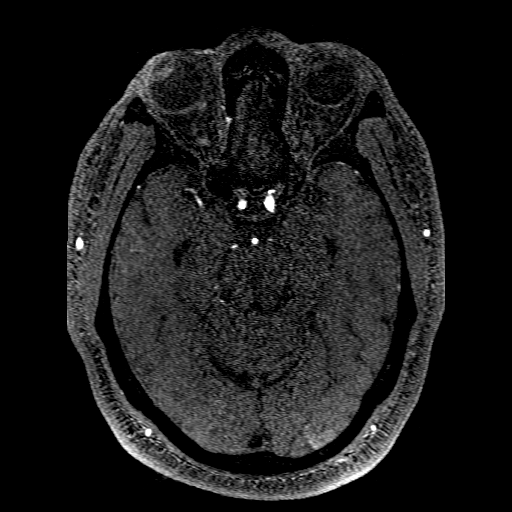
[im 78/176]
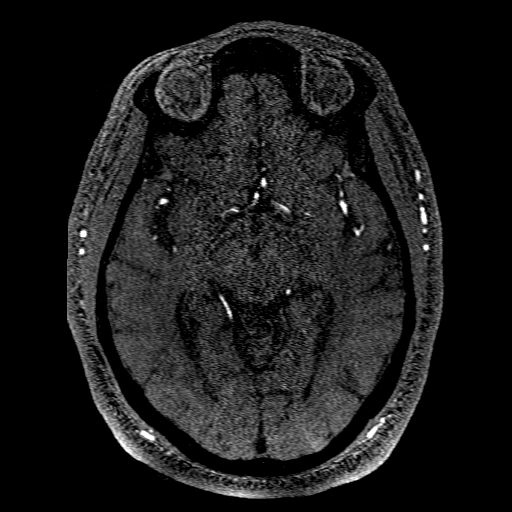
[im 90/176]
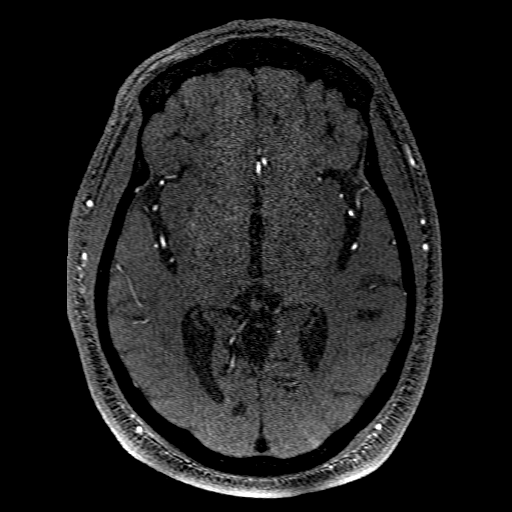
[im 98/176]
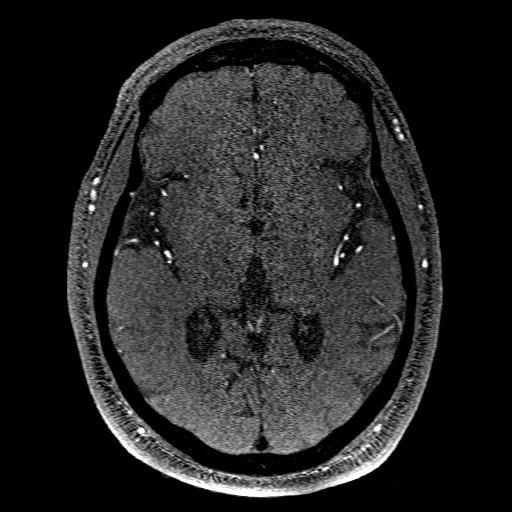
[im 121/176]
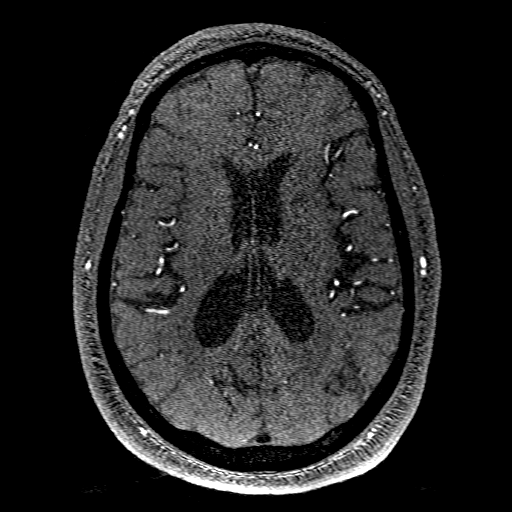
[im 144/176]
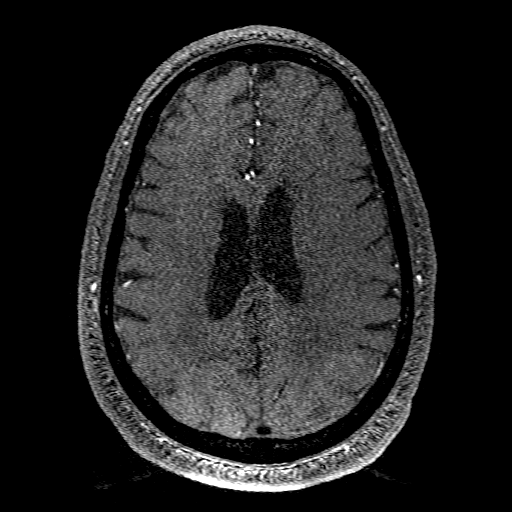
[im 148/176]
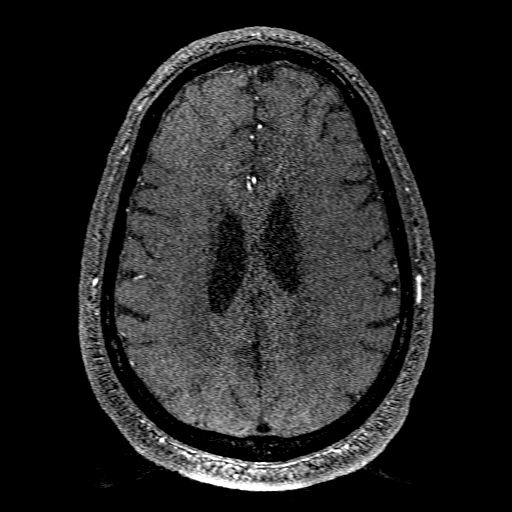
[im 168/176]
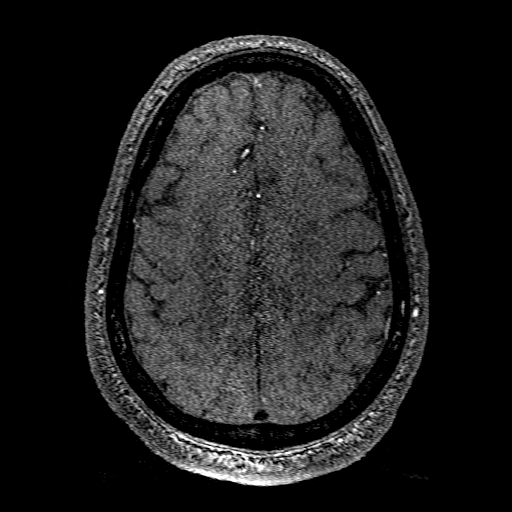

[Series 200: col:ax (id) · axial · 1.0mm · 0.43mm/px · 1 of 1 slices shown]
[im 1/1]
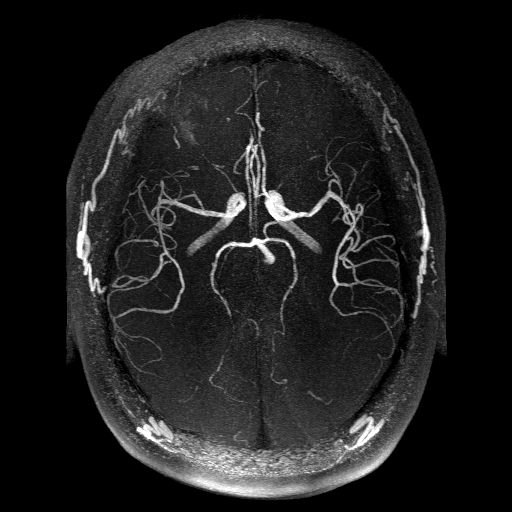

[Series 201: pjn:ax (id) · sagittal · 1.0mm · 0.43mm/px · 1 of 3 slices shown]
[im 1/3]
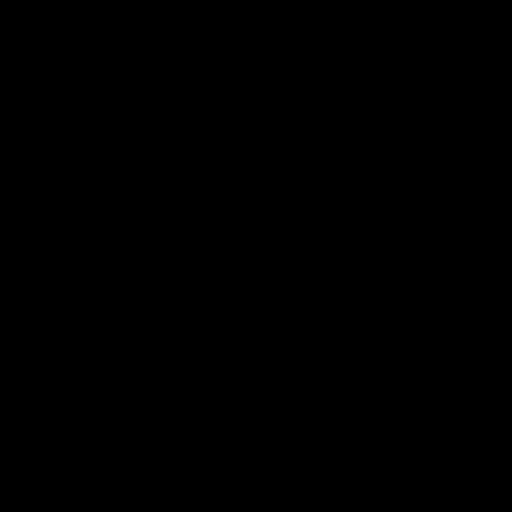

[20 of 48 positions shown; findings below may reference images not displayed]

FINDINGS: POSTERIOR CIRCULATION:

--Basilar artery: Normal.

--Superior cerebellar arteries: Normal.

--Posterior cerebral arteries: Normal.

ANTERIOR CIRCULATION:

--Intracranial internal carotid arteries: Normal.

--Anterior cerebral arteries (ACA): Normal. Hypoplastic right A1
segment, normal variant.

--Middle cerebral arteries (MCA): Normal.
IMPRESSION: Normal intracranial MRA.

## 2020-12-14 IMAGING — MR MR HEAD W/O CM
7 of 11 series · 25 of 48 positions shown · non-contrast
Comparison: None.

CLINICAL DATA: Stroke-like symptoms

EXAM:
MRI HEAD WITHOUT CONTRAST
TECHNIQUE: Multiplanar, multiecho pulse sequences of the brain and surrounding
structures were obtained without intravenous contrast.

[Series 2: DWI · axial · 3.0mm · 0.94mm/px · z∈[-78,+85]mm · 7 of 112 slices shown (1 of 2)]
[im 1/112]
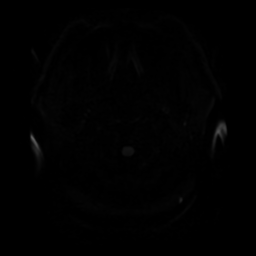
[im 19/112]
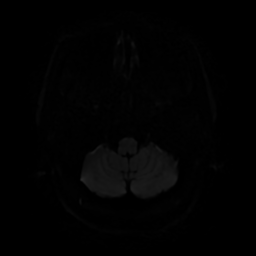
[im 38/112]
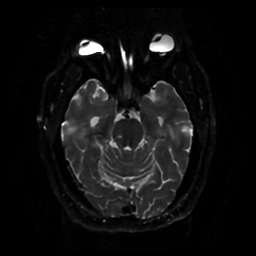
[im 56/112]
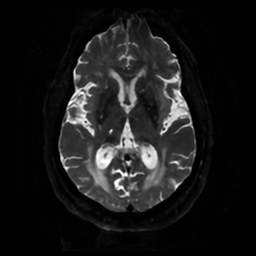
[im 75/112]
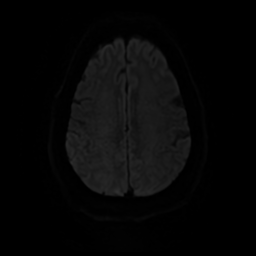
[im 93/112]
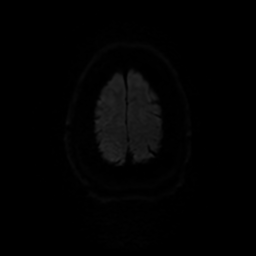
[im 112/112]
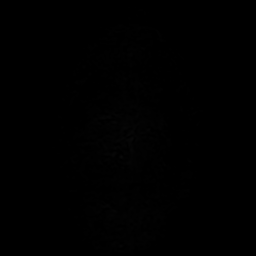

[Series 3: DWI · coronal · 4.0mm · 0.94mm/px · 6 of 79 slices shown (2 of 2)]
[im 1/79]
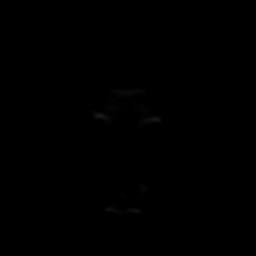
[im 16/79]
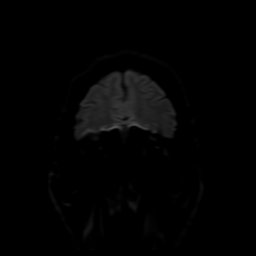
[im 32/79]
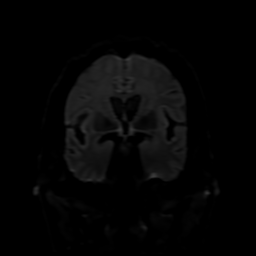
[im 47/79]
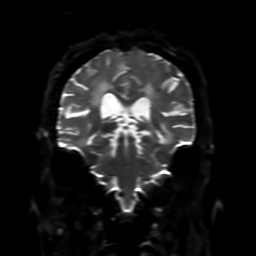
[im 63/79]
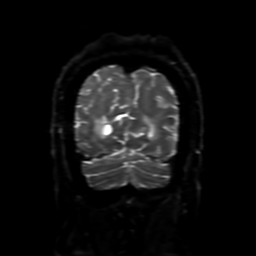
[im 79/79]
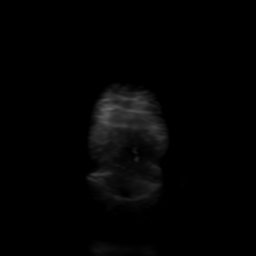

[Series 4: FLAIR · sagittal · 5.0mm · 0.23mm/px · 2 of 28 slices shown (1 of 2)]
[im 1/28]
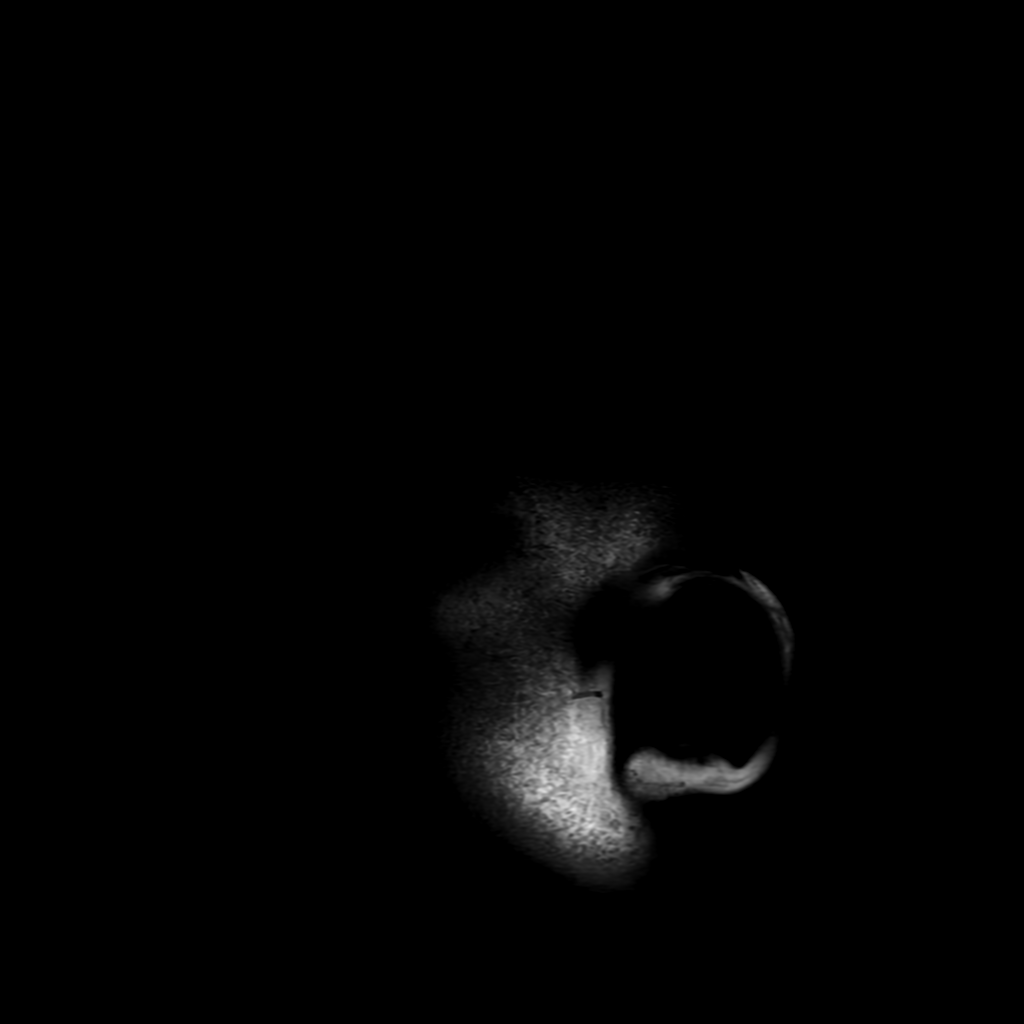
[im 28/28]
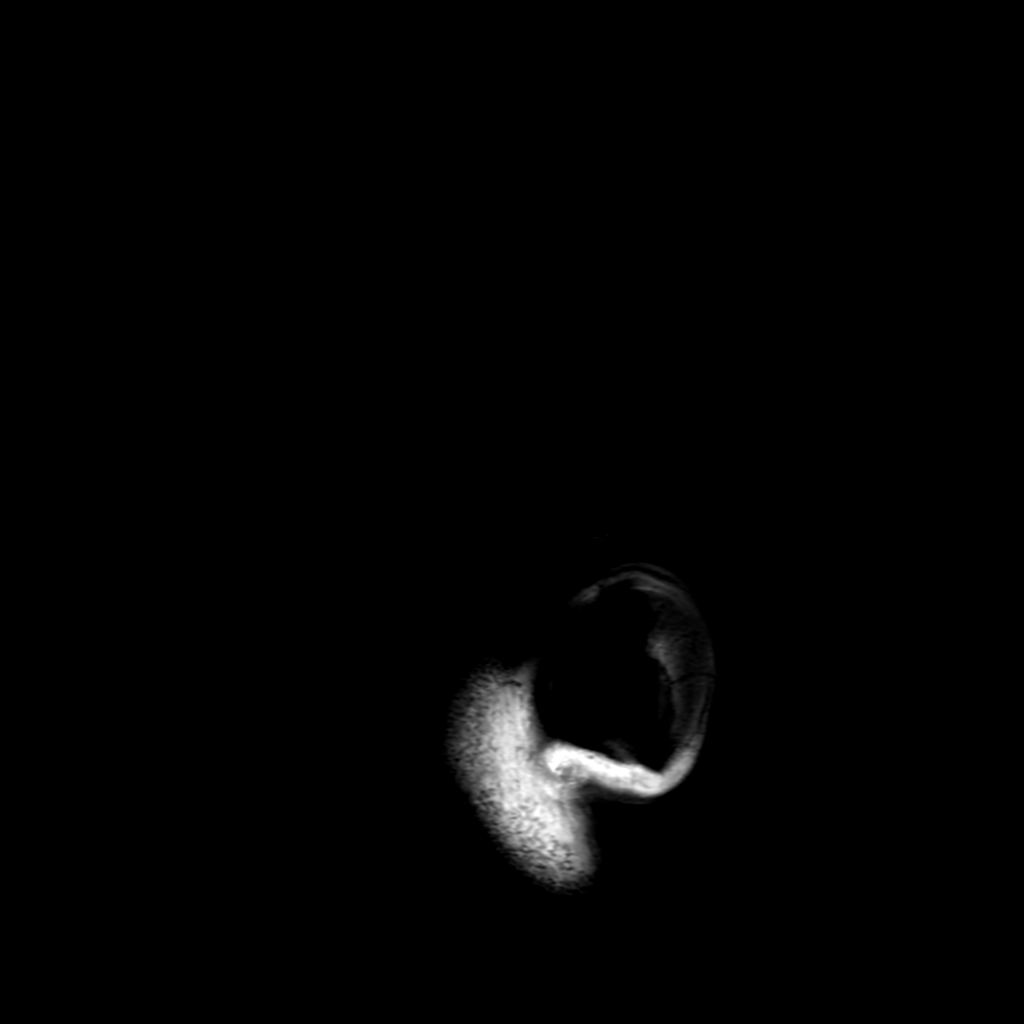

[Series 5: T2 · axial · 5.0mm · 0.23mm/px · 1 of 28 slices shown]
[im 1/28]
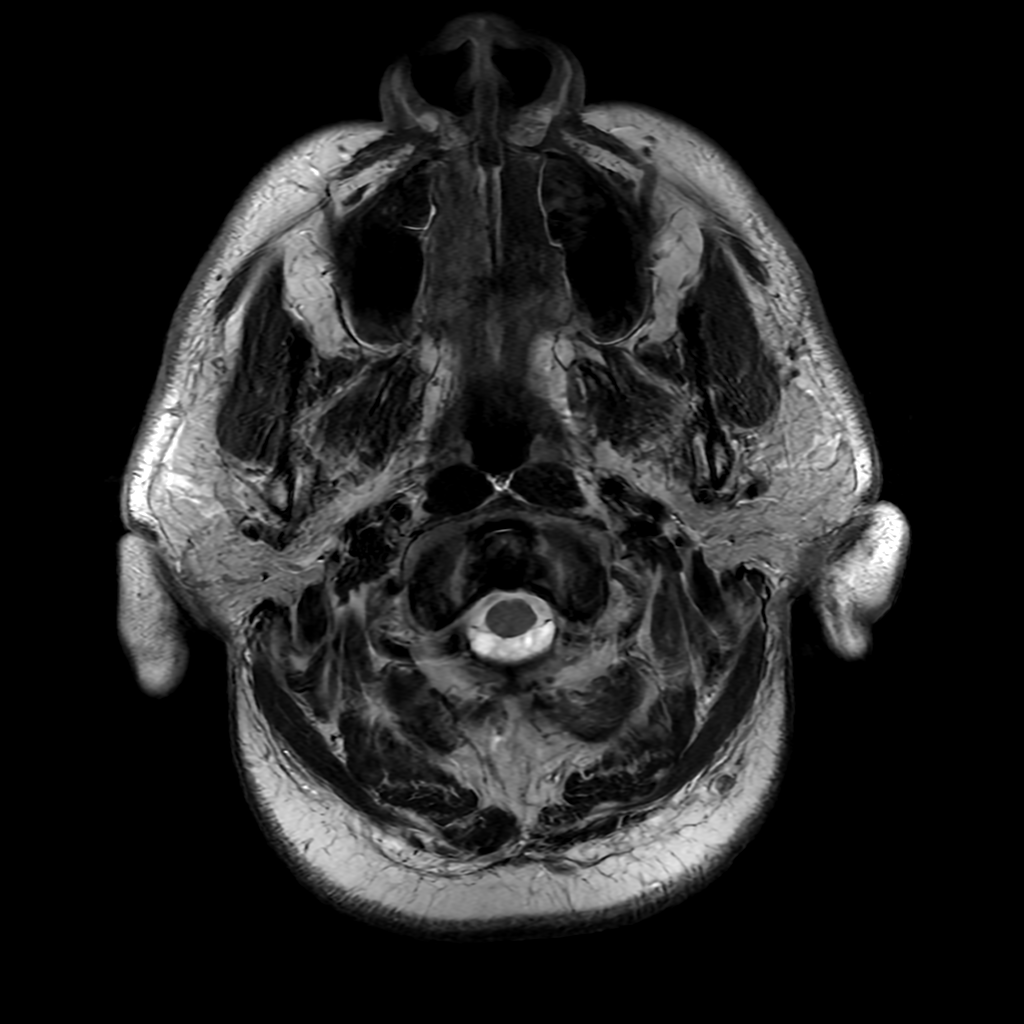

[Series 6: FLAIR · axial · 3.0mm · 0.45mm/px · z∈[-72,+89]mm · 2 of 28 slices shown (2 of 2)]
[im 1/28]
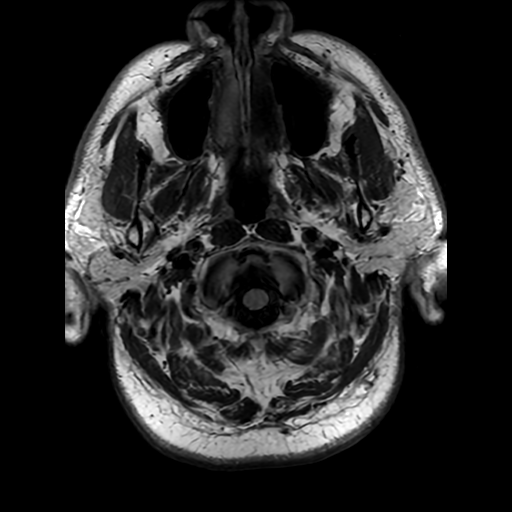
[im 28/28]
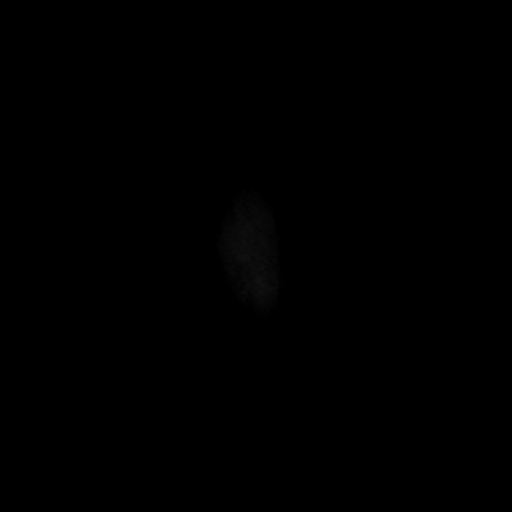

[Series 250: ADC · axial · 3.0mm · 0.94mm/px · z∈[-78,+85]mm · 4 of 56 slices shown (1 of 2)]
[im 1/56]
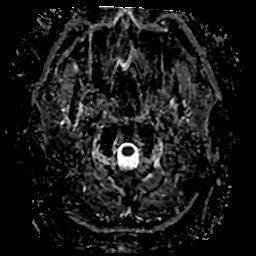
[im 19/56]
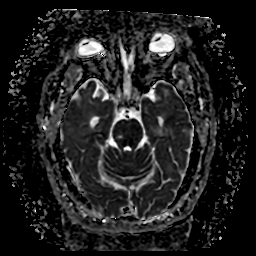
[im 37/56]
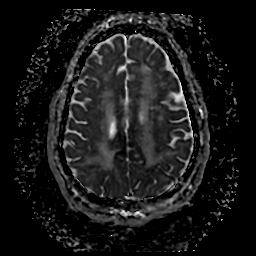
[im 56/56]
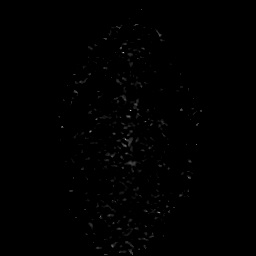

[Series 350: ADC · coronal · 4.0mm · 0.94mm/px · 3 of 40 slices shown (2 of 2)]
[im 1/40]
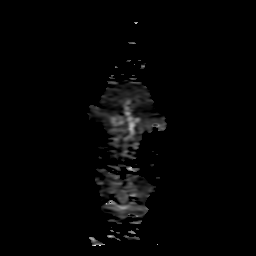
[im 20/40]
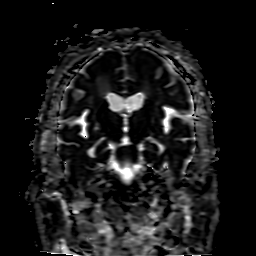
[im 40/40]
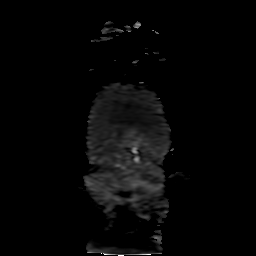

[25 of 48 positions shown; findings below may reference images not displayed]

FINDINGS: Brain: Small acute infarct of the right caudate tail. No acute or
chronic hemorrhage. Confluent hyperintense T2-weighted white matter
signal. Generalized volume loss without a clear lobar predilection.
There are multiple old small vessel infarcts of the brainstem and
deep gray nuclei. The midline structures are normal.

Vascular: Major flow voids are preserved.

Skull and upper cervical spine: Normal calvarium and skull base.
Visualized upper cervical spine and soft tissues are normal.

Sinuses/Orbits:No paranasal sinus fluid levels or advanced mucosal
thickening. No mastoid or middle ear effusion. Normal orbits.
IMPRESSION: 1. Small acute infarct of the right caudate tail. No hemorrhage or
mass effect.
2. Multiple old small vessel infarcts of the brainstem and deep gray
nuclei.

## 2020-12-14 IMAGING — DX DG CHEST 1V PORT
1 series · 1 of 1 positions shown · non-contrast
Comparison: None.

CLINICAL DATA: Altered mental status

EXAM:
PORTABLE CHEST 1 VIEW

[chest]
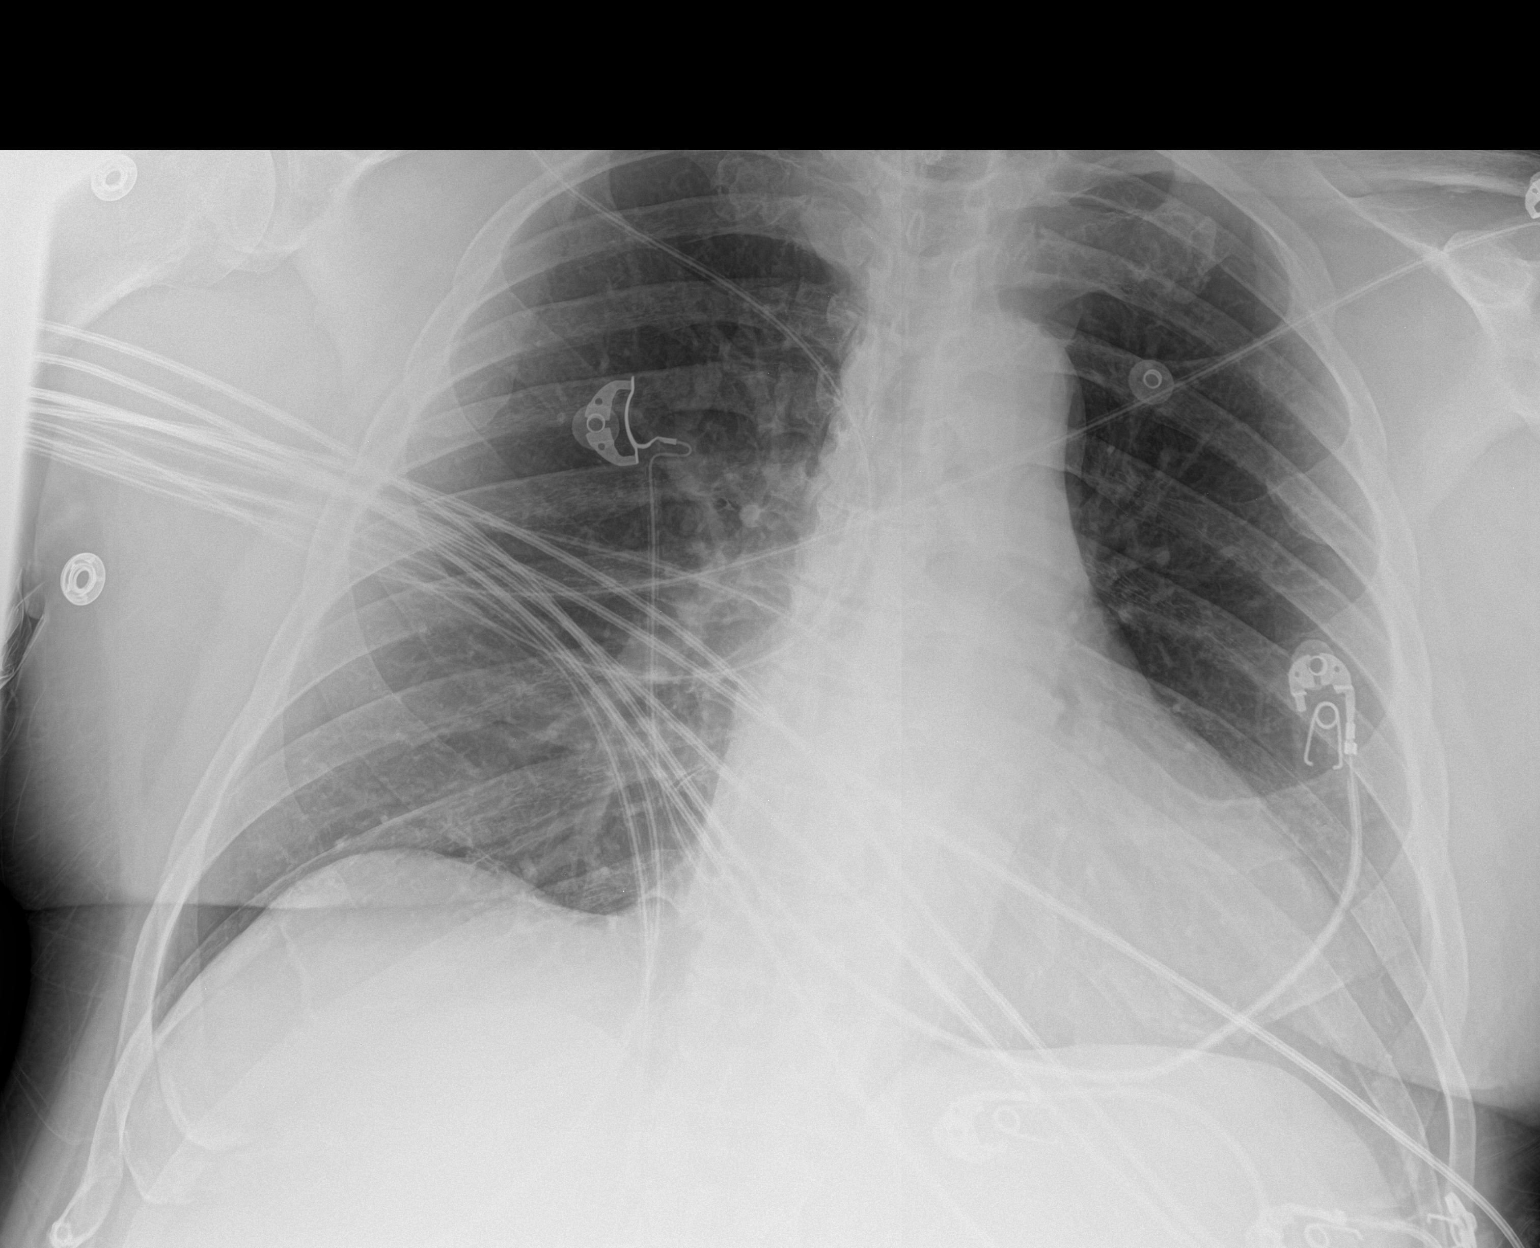

[1 of 1 positions shown; findings below may reference images not displayed]

FINDINGS: The heart size and mediastinal contours are within normal limits.
Both lungs are clear. The visualized skeletal structures are
unremarkable.
IMPRESSION: No active disease.

## 2020-12-14 MED ORDER — ASPIRIN 325 MG PO TABS: 325.0000 mg | ORAL_TABLET | Freq: Every day | ORAL | Status: DC

## 2020-12-14 MED ORDER — STROKE: EARLY STAGES OF RECOVERY BOOK
Freq: Once | Status: DC
  Filled 2020-12-14: qty 1

## 2020-12-14 MED ORDER — CLOPIDOGREL BISULFATE 75 MG PO TABS
75.0000 mg | ORAL_TABLET | Freq: Every day | ORAL | Status: DC
  Administered 2020-12-15 – 2020-12-17 (×3): 75 mg via ORAL
  Filled 2020-12-14 (×3): qty 1

## 2020-12-14 MED ORDER — LABETALOL HCL 5 MG/ML IV SOLN
5.0000 mg | INTRAVENOUS | Status: DC | PRN
  Administered 2020-12-15: 10 mg via INTRAVENOUS
  Administered 2020-12-15: 5 mg via INTRAVENOUS
  Filled 2020-12-14 (×2): qty 4

## 2020-12-14 MED ORDER — ACETAMINOPHEN 650 MG RE SUPP: 650.0000 mg | RECTAL | Status: DC | PRN

## 2020-12-14 MED ORDER — LIDOCAINE 5 % EX PTCH: 1.0000 | MEDICATED_PATCH | Freq: Every day | CUTANEOUS | Status: DC | PRN

## 2020-12-14 MED ORDER — ADULT MULTIVITAMIN W/MINERALS CH
1.0000 | ORAL_TABLET | Freq: Every day | ORAL | Status: DC
  Administered 2020-12-15 – 2020-12-17 (×3): 1 via ORAL
  Filled 2020-12-14 (×3): qty 1

## 2020-12-14 MED ORDER — ACETAMINOPHEN 160 MG/5ML PO SOLN: 650.0000 mg | ORAL | Status: DC | PRN

## 2020-12-14 MED ORDER — ASPIRIN 300 MG RE SUPP: 300.0000 mg | Freq: Every day | RECTAL | Status: DC

## 2020-12-14 MED ORDER — ENOXAPARIN SODIUM 40 MG/0.4ML IJ SOSY
40.0000 mg | PREFILLED_SYRINGE | INTRAMUSCULAR | Status: DC
  Administered 2020-12-15 – 2020-12-17 (×3): 40 mg via SUBCUTANEOUS
  Filled 2020-12-14 (×3): qty 0.4

## 2020-12-14 MED ORDER — LABETALOL HCL 5 MG/ML IV SOLN
10.0000 mg | Freq: Once | INTRAVENOUS | Status: AC
  Administered 2020-12-14: 10 mg via INTRAVENOUS
  Filled 2020-12-14: qty 4

## 2020-12-14 MED ORDER — AMLODIPINE BESYLATE 5 MG PO TABS
10.0000 mg | ORAL_TABLET | Freq: Once | ORAL | Status: AC
  Administered 2020-12-14: 10 mg via ORAL
  Filled 2020-12-14: qty 2

## 2020-12-14 MED ORDER — ASPIRIN EC 81 MG PO TBEC
81.0000 mg | DELAYED_RELEASE_TABLET | Freq: Every day | ORAL | Status: DC
  Administered 2020-12-15 – 2020-12-17 (×3): 81 mg via ORAL
  Filled 2020-12-14 (×3): qty 1

## 2020-12-14 MED ORDER — ACETAMINOPHEN 325 MG PO TABS: 650.0000 mg | ORAL_TABLET | ORAL | Status: DC | PRN

## 2020-12-14 NOTE — Consult Note (Addendum)
Neurology Consultation  Reason for Consult: Stroke Referring Physician: Dr. Julian Reil  CC: Left-sided weakness, high blood pressure  History is obtained from: Patient  HPI: Gary Mayer is a 65 y.o. male past medical history of diastolic CHF, hypertension-prescribed medications but chose to go off of medications and currently on no medications, CKD 3, presented to the emergency room for evaluation of left-sided weakness.  He reports that he was in his usual state of health sometime yesterday-unclear timing but this morning when he woke up, he noted difficulty using the left side of his body.  His niece also noted that he was slurring his words.  Upon questioning if he had any facial asymmetry he initially said no but when I examined him and he had some left facial droop and I asked him if this is new, he said yes this is also started this morning when he woke up. He was recommended and prescribed to take antihypertensives but decided to go off of it due to side effects-made him feel like a zombie. Noted to be extremely hypertensive with systolic in the 240s in the ER.  MRI of the brain was completed in the emergency room-showed small acute infarct in the right caudate tail/basal ganglia.  No bleed.   LKW: Greater than 24 hours ago-sometime yesterday morning tpa given?: no, outside the window Premorbid modified Rankin scale (mRS): 0  ROS: Full ROS was performed and is negative except as noted in the HPI.  Past Medical History:  Diagnosis Date  . CKD (chronic kidney disease) stage 3, GFR 30-59 ml/min (HCC)   . Diastolic CHF (HCC)   . Hypertension   . Kidney stone     Family History  Problem Relation Age of Onset  . Hypertension Sister   . Hypertension Other     Social History:   reports that he has never smoked. He has never used smokeless tobacco. He reports that he does not drink alcohol and does not use drugs.  Medications  Current Facility-Administered Medications:  .    stroke: mapping our early stages of recovery book, , Does not apply, Once, Julian Reil, Jared M, DO .  acetaminophen (TYLENOL) tablet 650 mg, 650 mg, Oral, Q4H PRN **OR** acetaminophen (TYLENOL) 160 MG/5ML solution 650 mg, 650 mg, Per Tube, Q4H PRN **OR** acetaminophen (TYLENOL) suppository 650 mg, 650 mg, Rectal, Q4H PRN, Hillary Bow, DO .  [START ON 12/15/2020] aspirin EC tablet 81 mg, 81 mg, Oral, Daily, Julian Reil, Jared M, DO .  [START ON 12/15/2020] clopidogrel (PLAVIX) tablet 75 mg, 75 mg, Oral, Daily, Julian Reil, Jared M, DO .  [START ON 12/15/2020] enoxaparin (LOVENOX) injection 40 mg, 40 mg, Subcutaneous, Q24H, Julian Reil, Jared M, DO .  labetalol (NORMODYNE) injection 5-10 mg, 5-10 mg, Intravenous, Q2H PRN, Julian Reil, Jared M, DO .  lidocaine (LIDODERM) 5 % 1 patch, 1 patch, Transdermal, Daily PRN, Julian Reil, Jared M, DO .  [START ON 12/15/2020] multivitamin with minerals tablet 1 tablet, 1 tablet, Oral, Daily, Julian Reil, Jared M, DO  Current Outpatient Medications:  .  amLODipine (NORVASC) 10 MG tablet, Take 10 mg by mouth daily., Disp: , Rfl:  .  lidocaine (LIDODERM) 5 %, Place 1 patch onto the skin daily as needed (pain)., Disp: , Rfl:  .  LUTEIN PO, Take 1 capsule by mouth daily., Disp: , Rfl:  .  Multiple Vitamins-Minerals (ONE-A-DAY MENS 50+) TABS, Take 1 tablet by mouth daily., Disp: , Rfl:  .  OVER THE COUNTER MEDICATION, Take 2 tablets by mouth daily.  Kyolic cardiovascular vitamin garlic based, Disp: , Rfl:  .  spironolactone (ALDACTONE) 50 MG tablet, Take 50 mg by mouth daily. (Patient not taking: Reported on 12/14/2020), Disp: , Rfl:    Exam: Current vital signs: BP (!) 188/158   Pulse 78   Temp 98.5 F (36.9 C) (Oral)   Resp 16   SpO2 98%  Vital signs in last 24 hours: Temp:  [98.5 F (36.9 C)] 98.5 F (36.9 C) (05/03 1813) Pulse Rate:  [76-99] 78 (05/03 2016) Resp:  [13-22] 16 (05/03 2016) BP: (188-236)/(110-158) 188/158 (05/03 2015) SpO2:  [98 %-100 %] 98 % (05/03 2016)  GENERAL:  Awake, alert in NAD HEENT: - Normocephalic and atraumatic, dry mm, no LN++, no Thyromegally LUNGS - Clear to auscultation bilaterally with no wheezes CV - S1S2 RRR, no m/r/g, equal pulses bilaterally. ABDOMEN - Soft, nontender, nondistended with normoactive BS Ext: warm, well perfused, intact peripheral pulses, no edema  NEURO:  Mental Status: AA&Ox3  Language: speech is mildly dysarthric.  Naming, repetition, fluency, and comprehension intact. Cranial Nerves: PERRL. EOMI, visual fields full, left lower facial weakness, facial sensation intact, hearing intact, tongue/uvula/soft palate midline, normal  sternocleidomastoid and trapezius muscle strength. No evidence of tongue atrophy or fibrillations Motor: Left upper extremity 4+/5 without any drift.  Left lower extremity 4/5 with mild subtle drift.  Right upper and lower extremity full strength without drift. Tone: is normal and bulk is normal Sensation- Intact to light touch bilaterally Coordination: FTN intact bilaterally, mild ataxia in the left lower extremity- likely proportionate to the weakness Gait- deferred  NIHSS-3   Labs I have reviewed labs in epic and the results pertinent to this consultation are:  CBC    Component Value Date/Time   WBC 6.1 12/14/2020 1818   RBC 5.11 12/14/2020 1818   HGB 14.8 12/14/2020 1818   HCT 44.5 12/14/2020 1818   PLT 192 12/14/2020 1818   MCV 87.1 12/14/2020 1818   MCH 29.0 12/14/2020 1818   MCHC 33.3 12/14/2020 1818   RDW 13.2 12/14/2020 1818   LYMPHSABS 1.2 12/14/2020 1818   MONOABS 0.5 12/14/2020 1818   EOSABS 0.1 12/14/2020 1818   BASOSABS 0.1 12/14/2020 1818    CMP     Component Value Date/Time   NA 136 12/14/2020 1818   K 3.5 12/14/2020 1818   CL 104 12/14/2020 1818   CO2 24 12/14/2020 1818   GLUCOSE 112 (H) 12/14/2020 1818   BUN 17 12/14/2020 1818   CREATININE 1.77 (H) 12/14/2020 1818   CALCIUM 9.2 12/14/2020 1818   PROT 7.4 12/14/2020 1818   ALBUMIN 4.1 12/14/2020 1818    AST 32 12/14/2020 1818   ALT 25 12/14/2020 1818   ALKPHOS 124 12/14/2020 1818   BILITOT 0.9 12/14/2020 1818   GFRNONAA 43 (L) 12/14/2020 1818   Imaging I have reviewed the images obtained: MRI brain with a right basal ganglia/caudate head lacunar appearing infarct. MR angiogram head- normal  Assessment:  65 year old with uncontrolled hypertension, noncompliant to medications presenting with sudden onset of left-sided facial droop, dysarthria and left-sided weakness and noted to have a right basal ganglia/caudate head lacunar infarct-likely small vessel etiology.  Impression:  Acute ischemic stroke- likely small vessel etiology-right basal ganglia/caudate head (Outside the window for IV tPA.  Exam not consistent with LVO)  Uncontrolled hypertension  Recommendations:  Admit to hospitalist  Frequent neurochecks  Telemetry  2D echo  CTA head and neck preferred but not done due to CKD.  MRA head completed.  For  neck vessels, please obtain carotid Dopplers.  A1c  Lipid panel  Permissive hypertension-treat only if systolic blood pressures greater than 220.  Gradually lower blood pressure with blood pressure goal at discharge 140/90.  Aspirin 81+ Plavix 75-for at least 3 weeks followed by aspirin only  Atorvastatin 80 mg p.o. now and daily  PT  OT next speech therapy  N.p.o. until cleared by bedside swallow evaluation  Plan discussed with Dr. Julian Reil.  Stroke team will follow with you.   -- Milon Dikes, MD Neurologist [facility] Pager: [phone]

## 2020-12-14 NOTE — ED Provider Notes (Addendum)
[facility] EMERGENCY DEPARTMENT Provider Note   CSN: [id number] Arrival date & time: 12/14/20  1756     History Chief Complaint  Patient presents with  . Stroke like symptoms     Gary Mayer is a 65 y.o. male.  Patient with history of hypertension, CKD who presents the ED with strokelike symptoms.  States that around maybe 10 AM this morning he was having some difficulty walking in, but he was having some weakness in his upper arms to.  May be left side worse than the right.  Denies any chest pain or shortness of breath or vision changes.  He has been having a lot of emotional outbursts per EMS as well.  He states that he has been crying sometimes and does not know why.  Family eventually came to the bedside and states that he had been less responsive this afternoon.  Then noticed that may be had slurred speech and was not walking normally.  He has not been compliant with his blood pressure medications.  They did not state that he had any unilateral weakness or facial droop.  The history is provided by the patient.  Neurologic Problem This is a new problem. The current episode started 6 to 12 hours ago. The problem has been resolved. Pertinent negatives include no chest pain, no abdominal pain, no headaches and no shortness of breath. Nothing aggravates the symptoms. Nothing relieves the symptoms. He has tried nothing for the symptoms.       Past Medical History:  Diagnosis Date  . CKD (chronic kidney disease) stage 3, GFR 30-59 ml/min (HCC)   . Diastolic CHF (HCC)   . Hypertension   . Kidney stone     Patient Active Problem List   Diagnosis Date Noted  . Acute ischemic stroke (HCC) 12/14/2020  . HTN (hypertension) 12/14/2020  . CKD (chronic kidney disease) stage 3, GFR 30-59 ml/min (HCC) 12/14/2020    History reviewed. No pertinent surgical history.     History reviewed. No pertinent family history.  Social History   Tobacco Use  . Smoking status:  Never Smoker  . Smokeless tobacco: Never Used  Substance Use Topics  . Alcohol use: Never  . Drug use: Never    Home Medications Prior to Admission medications   Not on File    Allergies    Patient has no allergy information on record.  Review of Systems   Review of Systems  Constitutional: Negative for chills and fever.  HENT: Negative for ear pain and sore throat.   Eyes: Negative for pain and visual disturbance.  Respiratory: Negative for cough and shortness of breath.   Cardiovascular: Negative for chest pain and palpitations.  Gastrointestinal: Negative for abdominal pain and vomiting.  Genitourinary: Negative for dysuria and hematuria.  Musculoskeletal: Negative for arthralgias and back pain.  Skin: Negative for color change and rash.  Neurological: Positive for speech difficulty and weakness. Negative for dizziness, tremors, seizures, syncope, facial asymmetry, light-headedness, numbness and headaches.  Psychiatric/Behavioral: Positive for confusion. Negative for suicidal ideas. The patient is not nervous/anxious.   All other systems reviewed and are negative.   Physical Exam Updated Vital Signs  ED Triage Vitals  Enc Vitals Group     BP 12/14/20 1808 (!) 236/131     Pulse Rate 12/14/20 1808 99     Resp 12/14/20 1808 18     Temp 12/14/20 1813 98.5 F (36.9 C)     Temp Source 12/14/20 1813 Oral  SpO2 12/14/20 1759 99 %     Weight --      Height --      Head Circumference --      Peak Flow --      Pain Score 12/14/20 1813 0     Pain Loc --      Pain Edu? --      Excl. in GC? --     Physical Exam Vitals and nursing note reviewed.  Constitutional:      General: He is not in acute distress.    Appearance: He is well-developed. He is not ill-appearing.  HENT:     Head: Normocephalic and atraumatic.     Nose: Nose normal.     Mouth/Throat:     Mouth: Mucous membranes are moist.  Eyes:     Extraocular Movements: Extraocular movements intact.      Conjunctiva/sclera: Conjunctivae normal.     Pupils: Pupils are equal, round, and reactive to light.  Cardiovascular:     Rate and Rhythm: Normal rate and regular rhythm.     Pulses: Normal pulses.     Heart sounds: Normal heart sounds. No murmur heard.   Pulmonary:     Effort: Pulmonary effort is normal. No respiratory distress.     Breath sounds: Normal breath sounds.  Abdominal:     Palpations: Abdomen is soft.     Tenderness: There is no abdominal tenderness.  Musculoskeletal:        General: No tenderness.     Cervical back: Normal range of motion and neck supple.  Skin:    General: Skin is warm and dry.  Neurological:     General: No focal deficit present.     Mental Status: He is alert and oriented to person, place, and time.     Cranial Nerves: No cranial nerve deficit.     Sensory: No sensory deficit.     Motor: No weakness.     Coordination: Coordination normal.     Gait: Gait normal.     Comments: 5+ out of 5 strength, normal sensation, no drift, normal finger-nose-finger, normal gait, normal speech, no visual field deficit  Psychiatric:     Comments: Anxious      ED Results / Procedures / Treatments   Labs (all labs ordered are listed, but only abnormal results are displayed) Labs Reviewed  BASIC METABOLIC PANEL - Abnormal; Notable for the following components:      Result Value   Glucose, Bld 112 (*)    Creatinine, Ser 1.77 (*)    GFR, Estimated 43 (*)    All other components within normal limits  URINALYSIS, ROUTINE W REFLEX MICROSCOPIC - Abnormal; Notable for the following components:   Hgb urine dipstick MODERATE (*)    Protein, ur 30 (*)    All other components within normal limits  SARS CORONAVIRUS 2 (TAT 6-24 HRS)  CBC WITH DIFFERENTIAL/PLATELET  HEPATIC FUNCTION PANEL  HIV ANTIBODY (ROUTINE TESTING W REFLEX)  HEMOGLOBIN A1C  LIPID PANEL  TROPONIN I (HIGH SENSITIVITY)  TROPONIN I (HIGH SENSITIVITY)    EKG EKG  Interpretation  Date/Time:  Tuesday Dec 14 2020 18:08:27 EDT Ventricular Rate:  108 PR Interval:  206 QRS Duration: 70 QT Interval:  320 QTC Calculation: 429 R Axis:   13 Text Interpretation: Sinus tachycardia Probable left atrial enlargement Anterior infarct, old Confirmed by Virgina Norfolk [id number]) on 12/14/2020 6:43:12 PM   Radiology CT Head Wo Contrast  Result Date: 12/14/2020 CLINICAL DATA:  Weakness  hypertension EXAM: CT HEAD WITHOUT CONTRAST TECHNIQUE: Contiguous axial images were obtained from the base of the skull through the vertex without intravenous contrast. COMPARISON:  None. FINDINGS: Brain: No acute territorial infarction, hemorrhage or intracranial mass. Advanced hypodensity in the white matter consistent with chronic small vessel ischemic change. Age indeterminate lacunar infarcts within the left basal ganglia/white matter, right thalamus and pons. Nonenlarged ventricles Vascular: No hyperdense vessels.  No unexpected calcification. Skull: Normal. Negative for fracture or focal lesion. Sinuses/Orbits: No acute finding. Other: None IMPRESSION: 1. Advanced chronic small vessel ischemic change of white matter. Age indeterminate lacunar infarcts within the left basal ganglia, left white matter, right thalamus and pons. 2. Negative for hemorrhage or intracranial mass Electronically Signed   By: Jasmine Pang M.D.   On: 12/14/2020 19:56   DG Chest Portable 1 View  Result Date: 12/14/2020 CLINICAL DATA:  Altered mental status EXAM: PORTABLE CHEST 1 VIEW COMPARISON:  None. FINDINGS: The heart size and mediastinal contours are within normal limits. Both lungs are clear. The visualized skeletal structures are unremarkable. IMPRESSION: No active disease. Electronically Signed   By: Jasmine Pang M.D.   On: 12/14/2020 19:56    Procedures .Critical Care Performed by: Virgina Norfolk, DO Authorized by: Virgina Norfolk, DO   Critical care provider statement:    Critical care time (minutes):   45   Critical care was necessary to treat or prevent imminent or life-threatening deterioration of the following conditions:  CNS failure or compromise   Critical care was time spent personally by me on the following activities:  Blood draw for specimens, development of treatment plan with patient or surrogate, discussions with consultants, discussions with primary provider, evaluation of patient's response to treatment, examination of patient, obtaining history from patient or surrogate, ordering and performing treatments and interventions, ordering and review of laboratory studies, ordering and review of radiographic studies, pulse oximetry and re-evaluation of patient's condition   I assumed direction of critical care for this patient from another provider in my specialty: no       Medications Ordered in ED Medications   stroke: mapping our early stages of recovery book (has no administration in time range)  acetaminophen (TYLENOL) tablet 650 mg (has no administration in time range)    Or  acetaminophen (TYLENOL) 160 MG/5ML solution 650 mg (has no administration in time range)    Or  acetaminophen (TYLENOL) suppository 650 mg (has no administration in time range)  enoxaparin (LOVENOX) injection 40 mg (has no administration in time range)  aspirin suppository 300 mg (has no administration in time range)    Or  aspirin tablet 325 mg (has no administration in time range)  labetalol (NORMODYNE) injection 10 mg (10 mg Intravenous Given 12/14/20 1830)  amLODipine (NORVASC) tablet 10 mg (10 mg Oral Given 12/14/20 1924)    ED Course  I have reviewed the triage vital signs and the nursing notes.  Pertinent labs & imaging results that were available during my care of the patient were reviewed by me and considered in my medical decision making (see chart for details).    MDM Rules/Calculators/A&P                          Dawsyn Ramsaran is a 65 year old male with history of CKD, hypertension and,  noncompliance who presents the ED with strokelike symptoms.  Patient hypertensive in the 230s upon arrival.  Neurologically he is intact.  However family states that he has  been off all day.  Having some slurring of his speech and having some emotional outburst but also have an abnormal gait.  Had an episode of nonresponsiveness prior to arrival as well with family.  No seizure activity.  Seems to be acting normally now.  Maybe he had left-sided weakness greater than right earlier this morning.  Overall suspect possibly TIA versus hypertensive encephalopathy.  He is on amlodipine used to be on labetalol but he does not take his medications.  We will give him a dose of IV labetalol and check basic labs including CT scan.  He is not having any active chest pain.  EKG shows sinus rhythm.  Lab work shows no significant anemia or electrolyte abnormality.  Troponin normal.  Creatinine is 1.7.  Head CT with no head bleed but age-indeterminate lacunar infarcts within the left basal ganglia, right thalamus and pons. Will get MRI to further eval for stroke/PRESS. Medicine on board with this plan.  Dr. Julian ReilGardner will evaluate the patient.    MRI shows stroke.  Dr. Jerrell BelfastAurora made aware.  Admitted to medicine.  This chart was dictated using voice recognition software.  Despite best efforts to proofread,  errors can occur which can change the documentation meaning.    Final Clinical Impression(s) / ED Diagnoses Final diagnoses:  Cerebrovascular accident (CVA), unspecified mechanism [facility](HCC)    Rx / DC Orders ED Discharge Orders    None       Virgina NorfolkCuratolo, Evellyn Tuff, DO 12/14/20 2036    Virgina Norfolkuratolo, Lajoyce Tamura, DO 12/14/20 2041    Virgina Norfolkuratolo, Daimian Sudberry, DO 12/14/20 2044    Virgina Norfolkuratolo, Meshell Abdulaziz, DO 12/14/20 2150

## 2020-12-14 NOTE — ED Notes (Signed)
Patient transported to MRI 

## 2020-12-14 NOTE — ED Triage Notes (Addendum)
Pt bibems from home. last seen normal at 8am. Around 10 am family started noticing abnormal gait and slurring his words and being very "emotional" per ems strength is equal. Per ems coordination is off in his left hand.

## 2020-12-14 NOTE — H&P (Addendum)
History and Physical    Gary HammanRobert Mayer ZOX:[id number] DOB: 1955-12-17 DOA: 12/14/2020  PCP: Pcp, No  Patient coming from: Home  I have personally briefly [facility]  Chief Complaint: stroke like symptoms  HPI: Gary HammanRobert Mayer is a 65 y.o. male with medical history significant of CKD 3, HTN, dCHF, LVH.  Pt admits he hasnt been taking HTN meds at home, has been exercising though.  Around 10am this morning he had onset of difficulty walking, weakness in upper arms, L side worse than R.  Per family less responsive this afternoon and they noticed slurred speech and that he wasn't walking normally.  Symptoms seemingly resolved at this time per patient.  Nothing makes better or worse.  No CP, no SOB, no vision changes.   ED Course: MRI reveals acute ischemic stroke of caudate tail.  Multiple old small vessel infarcts as well.  MRA nl.   Review of Systems: As per HPI, otherwise all review of systems negative.  Past Medical History:  Diagnosis Date  . CKD (chronic kidney disease) stage 3, GFR 30-59 ml/min (HCC)   . Diastolic CHF (HCC)   . Hypertension   . Kidney stone     History reviewed. No pertinent surgical history.   reports that he has never smoked. He has never used smokeless tobacco. He reports that he does not drink alcohol and does not use drugs.  No Known Allergies  Family History  Problem Relation Age of Onset  . Hypertension Sister   . Hypertension Other      Prior to Admission medications   Medication Sig Start Date End Date Taking? Authorizing Provider  amLODipine (NORVASC) 10 MG tablet Take 10 mg by mouth daily.   Yes [provider]  lidocaine (LIDODERM) 5 % Place 1 patch onto the skin daily as needed (pain). 01/17/18  Yes [provider]  Multiple Vitamins-Minerals (ONE-A-DAY MENS 50+) TABS Take 1 tablet by mouth daily.   Yes [provider]  spironolactone (ALDACTONE) 50 MG  tablet Take 50 mg by mouth daily. Patient not taking: Reported on 12/14/2020    [provider]    Physical Exam: Vitals:   12/14/20 1845 12/14/20 1900 12/14/20 2015 12/14/20 2016  BP: (!) 204/110 (!) 192/110 (!) 188/158   Pulse: 80 76 85 78  Resp: 18 13 (!) 22 16  Temp:      TempSrc:      SpO2: 100% 99% 98% 98%    Constitutional: NAD, calm, comfortable Eyes: PERRL, lids and conjunctivae normal ENMT: Mucous membranes are moist. Posterior pharynx clear of any exudate or lesions.Normal dentition.  Neck: normal, supple, no masses, no thyromegaly Respiratory: clear to auscultation bilaterally, no wheezing, no crackles. Normal respiratory effort. No accessory muscle use.  Cardiovascular: Regular rate and rhythm, no murmurs / rubs / gallops. No extremity edema. 2+ pedal pulses. No carotid bruits.  Abdomen: no tenderness, no masses palpated. No hepatosplenomegaly. Bowel sounds positive.  Musculoskeletal: no clubbing / cyanosis. No joint deformity upper and lower extremities. Good ROM, no contractures. Normal muscle tone.  Skin: no rashes, lesions, ulcers. No induration Neurologic: CN 2-12 grossly intact. Sensation intact, DTR normal. Strength 5/5 in all 4.  Psychiatric: Normal judgment and insight. Alert and oriented x 3. Normal mood.    Labs on Admission: I have personally reviewed following labs and imaging studies  CBC: Recent Labs  Lab 12/14/20 1818  WBC 6.1  NEUTROABS 4.2  HGB 14.8  HCT 44.5  MCV 87.1  PLT 192   Basic Metabolic Panel: Recent Labs  Lab 12/14/20 1818  NA 136  K 3.5  CL 104  CO2 24  GLUCOSE 112*  BUN 17  CREATININE 1.77*  CALCIUM 9.2   GFR: CrCl cannot be calculated (Unknown ideal weight.). Liver Function Tests: Recent Labs  Lab 12/14/20 1818  AST 32  ALT 25  ALKPHOS 124  BILITOT 0.9  PROT 7.4  ALBUMIN 4.1   No results for input(s): LIPASE, AMYLASE in the last 168 hours. No results for input(s): AMMONIA in the last 168  hours. Coagulation Profile: No results for input(s): INR, PROTIME in the last 168 hours. Cardiac Enzymes: No results for input(s): CKTOTAL, CKMB, CKMBINDEX, TROPONINI in the last 168 hours. BNP (last 3 results) No results for input(s): PROBNP in the last 8760 hours. HbA1C: No results for input(s): HGBA1C in the last 72 hours. CBG: No results for input(s): GLUCAP in the last 168 hours. Lipid Profile: No results for input(s): CHOL, HDL, LDLCALC, TRIG, CHOLHDL, LDLDIRECT in the last 72 hours. Thyroid Function Tests: No results for input(s): TSH, T4TOTAL, FREET4, T3FREE, THYROIDAB in the last 72 hours. Anemia Panel: No results for input(s): VITAMINB12, FOLATE, FERRITIN, TIBC, IRON, RETICCTPCT in the last 72 hours. Urine analysis:    Component Value Date/Time   COLORURINE YELLOW 12/14/2020 1921   APPEARANCEUR CLEAR 12/14/2020 1921   LABSPEC 1.011 12/14/2020 1921   PHURINE 7.0 12/14/2020 1921   GLUCOSEU NEGATIVE 12/14/2020 1921   HGBUR MODERATE (A) 12/14/2020 1921   BILIRUBINUR NEGATIVE 12/14/2020 1921   KETONESUR NEGATIVE 12/14/2020 1921   PROTEINUR 30 (A) 12/14/2020 1921   NITRITE NEGATIVE 12/14/2020 1921   LEUKOCYTESUR NEGATIVE 12/14/2020 1921    Radiological Exams on Admission: CT Head Wo Contrast  Result Date: 12/14/2020 CLINICAL DATA:  Weakness hypertension EXAM: CT HEAD WITHOUT CONTRAST TECHNIQUE: Contiguous axial images were obtained from the base of the skull through the vertex without intravenous contrast. COMPARISON:  None. FINDINGS: Brain: No acute territorial infarction, hemorrhage or intracranial mass. Advanced hypodensity in the white matter consistent with chronic small vessel ischemic change. Age indeterminate lacunar infarcts within the left basal ganglia/white matter, right thalamus and pons. Nonenlarged ventricles Vascular: No hyperdense vessels.  No unexpected calcification. Skull: Normal. Negative for fracture or focal lesion. Sinuses/Orbits: No acute finding.  Other: None IMPRESSION: 1. Advanced chronic small vessel ischemic change of white matter. Age indeterminate lacunar infarcts within the left basal ganglia, left white matter, right thalamus and pons. 2. Negative for hemorrhage or intracranial mass Electronically Signed   By: Jasmine Pang M.D.   On: 12/14/2020 19:56   MR ANGIO HEAD WO CONTRAST  Result Date: 12/14/2020 CLINICAL DATA:  Stroke-like symptoms EXAM: MRA HEAD WITHOUT CONTRAST TECHNIQUE: Angiographic images of the Circle of Willis were obtained using MRA technique without intravenous contrast. COMPARISON:  None. FINDINGS: POSTERIOR CIRCULATION: --Basilar artery: Normal. --Superior cerebellar arteries: Normal. --Posterior cerebral arteries: Normal. ANTERIOR CIRCULATION: --Intracranial internal carotid arteries: Normal. --Anterior cerebral arteries (ACA): Normal. Hypoplastic right A1 segment, normal variant. --Middle cerebral arteries (MCA): Normal. IMPRESSION: Normal intracranial MRA. Electronically Signed   By: Deatra Robinson M.D.   On: 12/14/2020 22:23   MR Brain Wo Contrast (neuro protocol)  Result Date: 12/14/2020 CLINICAL DATA:  Stroke-like symptoms EXAM: MRI HEAD WITHOUT CONTRAST TECHNIQUE: Multiplanar, multiecho pulse sequences of the brain and surrounding structures were obtained without intravenous contrast. COMPARISON:  None. FINDINGS: Brain: Small acute infarct of the right caudate tail. No acute or chronic hemorrhage. Confluent  hyperintense T2-weighted white matter signal. Generalized volume loss without a clear lobar predilection. There are multiple old small vessel infarcts of the brainstem and deep gray nuclei. The midline structures are normal. Vascular: Major flow voids are preserved. Skull and upper cervical spine: Normal calvarium and skull base. Visualized upper cervical spine and soft tissues are normal. Sinuses/Orbits:No paranasal sinus fluid levels or advanced mucosal thickening. No mastoid or middle ear effusion. Normal orbits.  IMPRESSION: 1. Small acute infarct of the right caudate tail. No hemorrhage or mass effect. 2. Multiple old small vessel infarcts of the brainstem and deep gray nuclei. Electronically Signed   By: Deatra Robinson M.D.   On: 12/14/2020 22:10   DG Chest Portable 1 View  Result Date: 12/14/2020 CLINICAL DATA:  Altered mental status EXAM: PORTABLE CHEST 1 VIEW COMPARISON:  None. FINDINGS: The heart size and mediastinal contours are within normal limits. Both lungs are clear. The visualized skeletal structures are unremarkable. IMPRESSION: No active disease. Electronically Signed   By: Jasmine Pang M.D.   On: 12/14/2020 19:56    EKG: Independently reviewed.  Assessment/Plan Principal Problem:   Acute ischemic stroke (HCC) Active Problems:   HTN (hypertension)   CKD (chronic kidney disease) stage 3, GFR 30-59 ml/min (HCC)    1. Acute ischemic stroke - 1. Stroke pathway 2. Neuro consult 3. Tele monitor 4. 2d echo 5. Carotid dopplers 6. PT/OT/SLP 7. A1C, FLP 8. ASA 81 + plavix for now 2. HTN - 1. Hold home BP meds 2. Allow permissive HTN to 220 systolic 3. PRN labetalol if needed 3. CKD 3 - 1. Due to uncontrolled HTN 2. Saw nephrology in 2019  DVT prophylaxis: Lovenox Code Status: Full Family Communication: Family at bedside Disposition Plan: Home after stroke workup Consults called: Dr. Jerrell Belfast Admission status: Admit to inpatient  Severity of Illness: The appropriate patient status for this patient is INPATIENT. Inpatient status is judged to be reasonable and necessary in order to provide the required intensity of service to ensure the patient's safety. The patient's presenting symptoms, physical exam findings, and initial radiographic and laboratory data in the context of their chronic comorbidities is felt to place them at high risk for further clinical deterioration. Furthermore, it is not anticipated that the patient will be medically stable for discharge from the hospital within  2 midnights of admission. The following factors support the patient status of inpatient.   IP status for acute ischemic stroke, permissive HTN.   * I certify that at the point of admission it is my clinical judgment that the patient will require inpatient hospital care spanning beyond 2 midnights from the point of admission due to high intensity of service, high risk for further deterioration and high frequency of surveillance required.*    Casyn Becvar M. DO [facility]  How to contact the [facility] Attending or Consulting provider 7A - 7P or covering provider during after hours 7P -7A, for this patient?  1. Check the care team in [facility] and look for a) attending/consulting [facility] provider listed and b) the [facility] team listed 2. Log into [email or web address]  Amion Physician Scheduling and messaging for groups and whole hospitals  On call and physician scheduling software for group practices, residents, hospitalists and other medical providers for call, clinic, rotation and shift schedules. On[employer] is a hospital-wide system for scheduling doctors and paging doctors on call. EasyPlot is for scientific plotting and data analysis.  [email or web address]  and use [place]'s universal password to access. If you do  not have the password, please contact the hospital operator.  3. Locate the [facility] provider you are looking for under [facility] and page to a number that you can be directly reached. 4. If you still have difficulty reaching the provider, please page the [facility] (Director on Call) for the Hospitalists listed on amion for assistance.  12/14/2020, 10:48 PM

## 2020-12-15 ENCOUNTER — Inpatient Hospital Stay (HOSPITAL_COMMUNITY): Payer: Federal, State, Local not specified - PPO

## 2020-12-15 DIAGNOSIS — I6389 Other cerebral infarction: Secondary | ICD-10-CM

## 2020-12-15 DIAGNOSIS — I639 Cerebral infarction, unspecified: Secondary | ICD-10-CM

## 2020-12-15 LAB — LIPID PANEL
Cholesterol: 193 mg/dL (ref 0–200)
HDL: 56 mg/dL (ref >40)
LDL Cholesterol: 131 mg/dL — ABNORMAL HIGH (ref 0–99)
Total CHOL/HDL Ratio: 3.4 RATIO
Triglycerides: 32 mg/dL (ref <150)
VLDL: 6 mg/dL (ref 0–40)

## 2020-12-15 LAB — HIV ANTIBODY (ROUTINE TESTING W REFLEX): HIV Screen 4th Generation wRfx: NONREACTIVE

## 2020-12-15 LAB — ECHOCARDIOGRAM COMPLETE
Area-P 1/2: 4.39 cm2
S' Lateral: 2.9 cm

## 2020-12-15 LAB — RAPID URINE DRUG SCREEN, HOSP PERFORMED
Amphetamines: NOT DETECTED
Barbiturates: NOT DETECTED
Benzodiazepines: NOT DETECTED
Cocaine: NOT DETECTED
Opiates: NOT DETECTED
Tetrahydrocannabinol: NOT DETECTED

## 2020-12-15 LAB — HEMOGLOBIN A1C
Hgb A1c MFr Bld: 5.7 % — ABNORMAL HIGH (ref 4.8–5.6)
Mean Plasma Glucose: 116.89 mg/dL

## 2020-12-15 LAB — SARS CORONAVIRUS 2 (TAT 6-24 HRS): SARS Coronavirus 2: NEGATIVE

## 2020-12-15 MED ORDER — ATORVASTATIN CALCIUM 40 MG PO TABS
40.0000 mg | ORAL_TABLET | Freq: Every day | ORAL | Status: DC
  Administered 2020-12-15 – 2020-12-17 (×3): 40 mg via ORAL
  Filled 2020-12-15 (×3): qty 1

## 2020-12-15 NOTE — Progress Notes (Incomplete)
Neuro - awake, alert, eyes open, orientated to age, place, time and people. No aphasia, fluent language, following all simple commands. Able to name and repeat. No gaze palsy, tracking bilaterally, visual field full, PERRL. Left facial droop. Tongue midline. RUE and RLE 5/5, LUE 4+/5 proximal and distal, LLE 5-5. Sensation symmetrical bilaterally, b/l FTN intact but slow on the left, gait not tested.

## 2020-12-15 NOTE — Progress Notes (Addendum)
STROKE TEAM PROGRESS NOTE   INTERVAL HISTORY  Patient developed left sided weaness, slurred speech, and left facial droop while he was in the car with a family member.  He was taken to the emergency department and was found to be hypertensive with systolics in the 240s.  An MRI of the brain was obtained which showed a small acute infarct in the right caudate tale/basal ganglia.   Vitals:   12/15/20 0230 12/15/20 0300 12/15/20 0445 12/15/20 0530  BP: (!) 154/95 (!) 159/95 (!) 188/108 (!) 162/99  Pulse: 67 64 68 68  Resp:  16 20 17   Temp:      TempSrc:      SpO2: 93% 95% 94% 94%   CBC:  Recent Labs  Lab 12/14/20 1818  WBC 6.1  NEUTROABS 4.2  HGB 14.8  HCT 44.5  MCV 87.1  PLT 192   Basic Metabolic Panel:  Recent Labs  Lab 12/14/20 1818  NA 136  K 3.5  CL 104  CO2 24  GLUCOSE 112*  BUN 17  CREATININE 1.77*  CALCIUM 9.2   Lipid Panel:  Recent Labs  Lab 12/15/20 0403  CHOL 193  TRIG 32  HDL 56  CHOLHDL 3.4  VLDL 6  LDLCALC 131*   HgbA1c:  Recent Labs  Lab 12/15/20 0403  HGBA1C 5.7*   Urine Drug Screen: No results for input(s): LABOPIA, COCAINSCRNUR, LABBENZ, AMPHETMU, THCU, LABBARB in the last 168 hours.   IMAGING past 24 hours CT Head Wo Contrast Result Date: 12/14/2020 IMPRESSION:  1. Advanced chronic small vessel ischemic change of white matter. Age indeterminate lacunar infarcts within the left basal ganglia, left white matter, right thalamus and pons.  2. Negative for hemorrhage or intracranial mass   MR ANGIO HEAD WO CONTRAST Result Date: 12/14/2020 CLINICAL DATA:  Stroke-like symptoms EXAM: MRA HEAD  IMPRESSION: Normal intracranial MRA.   MR Brain Wo Contrast (neuro protocol) Result Date: 12/14/2020 IMPRESSION:  1. Small acute infarct of the right caudate tail. No hemorrhage or mass effect.  2. Multiple old small vessel infarcts of the brainstem and deep gray nuclei.   DG Chest Portable 1 View Result Date: 12/14/2020 IMPRESSION: No active disease.    12/15/2020 Echo 1.  Left ventricular ejection fraction, by estimation, is 60 to 65%. The left ventricle has normal function. The left ventricle has no regional wall motion abnormalities. There is mild left ventricular hypertrophy. Left ventricular diastolic parameters were normal. 2. Right ventricular systolic function is normal. The right ventricular size is normal. 3. Left atrial size was mildly dilated. 4. The mitral valve is normal in structure. No evidence of mitral valve regurgitation. No evidence of mitral stenosis. 5. The aortic valve is tricuspid. There is mild calcification of the aortic valve. Aortic valve regurgitation is not visualized. Mild aortic valve sclerosis is present, with no evidence of aortic valve stenosis. 6. The inferior vena cava is normal in size with greater than 50% respiratory variability, suggesting right atrial pressure of 3 mmHg.   PHYSICAL EXAM GENERAL: Awake, alert in NAD HEENT: - Normocephalic and atraumatic, dry mm LUNGS - Clear to auscultation bilaterally with no wheezes CV - S1S2 RRR, no m/r/g, equal pulses bilaterally. ABDOMEN - Soft, nontender, nondistended with normoactive BS Ext: warm, well perfused, intact peripheral pulses, no edema  NEURO:  Mental Status: AA&Ox3  Language: speech is mildly dysarthric.  Naming, repetition, fluency, and comprehension intact. Cranial Nerves: PERRL. EOMI, visual fields full, left lower facial weakness, facial sensation intact, hearing intact, tongue/uvula/soft  palate midline, normal  sternocleidomastoid and trapezius muscle strength. No evidence of tongue atrophy  Motor:  RUE and RLE 5/5, LUE 4+/5 proximal and distal, LLE 5-5.  Tone: is normal and bulk is normal Sensation- Intact to light touch bilaterally Coordination: FTN intact bilaterally but slow on the left Gait- not tested  ASSESSMENT/PLAN [name] is a 65 y.o. male with history of diastolic CHF, hypertension-currently on no medications, CKD  3 presenting with left sided weakness, slurred speech, left facial droop.  tPa not given as patient was outside the window.   Stroke:  right CR infarct likely small vessel disease  CT head: Left BG lacunar infarcts, chronic small vessel ischemic changes of white matter  MRI  Brain: small acute infact of the right caudate tail  MRA head:  unremarkable  Carotid Doppler  No high grade stenosis  2D Echo: EF 60-65%, mild LVH  LDL 131  HgbA1c 5.7  VTE prophylaxis - lovenox  Heart healthy diet  No antithrombotic prior to admission, now on aspirin 81 mg daily and clopidogrel 75 mg daily. DAPT with Aspirin 81mg  and Plavix 75mg  for 3 weeks then aspirin alone  Therapy recommendations:  home health PT/OT, rolling walker, bedside commode  Disposition:  pending  Hypertension  Home meds:  Stopped taking amlodipine and spironolactone. Daughter stated that "patient is noncompliant with medications"  Unstable  Started on amlodipine 10mg  daily and monitor side effects  PRN labetolol . Gradually normalize BP in 5-7 days . Long-term BP goal normotensive  Hyperlipidemia  Home meds:  none,   LDL 131, goal < 70  Add atorvastatin 80mg  daily  Continue statin at discharge  CKD 3  Likely due to uncontrolled hypertension  Initially seen by a nephrologist in 2019, creatinine at that time was 1.87  Creatinine level 1.71, will continue to monitor  Other Stroke Risk Factors  Obesity: recommend weight loss, diet and exercise as appropriate   Diastolic CHF  Hospital day # 1  Gary Mayer, ACNP-BC Stroke NP  ATTENDING NOTE: I reviewed above note and agree with the assessment and plan. Pt was seen and examined.   65 year old male with history of hypertension, CKD, HLD not compliant with medication admitted for left-sided weakness, slurry speech and left facial droop.  BP was high on presentation.  MRI showed right caudate/BG infarct, chronic lacunar infarct at the  brainstem and deep gray nuclei.  MRA unremarkable, carotid Doppler unremarkable, EF 60 to 65%.  LDL 131, A1c 5.3.  UDS negative.  On exam, wife at bedside, awake, alert, eyes open, orientated to age, place, time and people. No aphasia, fluent language, following all simple commands. Able to name and repeat. No gaze palsy, tracking bilaterally, visual field full, PERRL. Left facial droop. Tongue midline. RUE and RLE 5/5, LUE 4+/5 proximal and distal, LLE 5-5. Sensation symmetrical bilaterally, b/l FTN intact but slow on the left, gait not tested.   Etiology for patient stroke likely due to small vessel disease.  Recommend further stroke risk factor modification.  Recommend aspirin 81 and Plavix 75 DAPT for 3 weeks and then aspirin alone.  Continue Lipitor 40.  PT/OT recommend home health PT/OT.    For detailed assessment and plan, please refer to above as I have made changes wherever appropriate.   Neurology will sign off. Please call with questions. Patient lives in , he will follow-up with local neurology.  Thanks for the consult   , MD PhD Stroke Neurology 12/15/2020 7:39 PM  To contact Stroke Continuity provider, please refer to [email or web address] After hours, contact General Neurology

## 2020-12-15 NOTE — ED Notes (Signed)
Attempted report x1. 

## 2020-12-15 NOTE — Evaluation (Signed)
Physical Therapy Evaluation Patient Details Name: Gary Mayer MRN: [id number] DOB: 05-Aug-1956 Today's Date: 12/15/2020   History of Present Illness  Pt is a 65 year old man admitted with L side weakness and slurred speech on 12/14/20. 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 PMH: HTN with poor med compliance, CHF, CKD 3.  Clinical Impression  Pt admitted with above diagnosis. Pt presents with mild L LE weakness, decreased balance and mobility, cognitive deficits, and mild L neglect/inattention.  Pt is very pleasant at motivated. 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  Pt currently with functional limitations due to the deficits listed below (see PT Problem List). Pt will benefit from skilled PT to increase their independence and safety with mobility to allow discharge to the venue listed below.       Follow Up Recommendations Home health PT;Supervision for mobility/OOB (progress to outpt PT)    Equipment Recommendations  Rolling walker with 5" wheels    Recommendations for Other Services       Precautions / Restrictions Precautions Precautions: Fall      Mobility  Bed Mobility Overal bed mobility: Needs Assistance Bed Mobility: Supine to Sit     Supine to sit: Supervision     General bed mobility comments: in chair at arrival; up with OT    Transfers Overall transfer level: Needs assistance Equipment used: Rolling walker (2 wheeled) Transfers: Sit to/from [employer] Sit to Stand: Min guard Stand pivot transfers: Min guard       General transfer comment: cues for hand placement, steadying assist  Ambulation/Gait Ambulation/Gait assistance: Min guard Gait Distance (Feet): 500 Feet Assistive device: Rolling walker (2 wheeled);None Gait Pattern/deviations: Step-through pattern;Decreased dorsiflexion - left Gait velocity:  decreased   General Gait Details: Ambulated 400' with RW with mild deficits on L side including decreased dorsiflexion, foot clearance, and stance time but no LOB.  Ambulated 100' without RW and did not have LOB but L side "lagged behind" making pt walk slightly sideways.  Needed cues to avoid objects in tight spaces on L with and without RW  Stairs            Wheelchair Mobility    Modified Rankin (Stroke Patients Only) Modified Rankin (Stroke Patients Only) Modified Rankin: Moderate disability     Balance Overall balance assessment: Needs assistance Sitting-balance support: No upper extremity supported Sitting balance-Leahy Scale: Good Sitting balance - Comments: no LOB donning socks   Standing balance support: No upper extremity supported Standing balance-Leahy Scale: Fair Standing balance comment: Can stand statically without AD and did ambulate some but was unsteady without AD                             Pertinent Vitals/Pain Pain Assessment: No/denies pain    Home Living Family/patient expects to be discharged to:: Private residence Living Arrangements: Alone Available Help at Discharge: Family;Available PRN/intermittently Type of Home: House Home Access: Stairs to enter Entrance Stairs-Rails: None Entrance Stairs-Number of Steps: 2+2 Home Layout: Two level;1/2 bath on main level Home Equipment: None Additional Comments: Pt from [facility]. and is here visiting sister    Prior Function Level of Independence: Independent         Comments: walks 5 miles a day, works as a Curator for [employer]  Hand Dominance   Dominant Hand: Left    Extremity/Trunk Assessment   Upper Extremity Assessment Upper Extremity Assessment: Defer to OT evaluation LUE Deficits / Details: 4/5 strength LUE Sensation: decreased proprioception LUE Coordination: decreased fine motor;decreased gross motor    Lower Extremity Assessment Lower Extremity  Assessment: LLE deficits/detail;RLE deficits/detail RLE Deficits / Details: ROM WFL; MMT 5/5 RLE Sensation: WNL RLE Coordination: WNL LLE Deficits / Details: ROM WFL; MMT 5/5 but noting decreased dorsiflexion with gait LLE Sensation: WNL LLE Coordination: WNL    Cervical / Trunk Assessment Cervical / Trunk Assessment: Normal  Communication   Communication: Expressive difficulties (easily understood)  Cognition Arousal/Alertness: Awake/alert Behavior During Therapy: WFL for tasks assessed/performed Overall Cognitive Status: Impaired/Different from baseline Area of Impairment: Memory;Safety/judgement;Problem solving                         Safety/Judgement: Decreased awareness of deficits   Problem Solving:  (decreased higher level task) General Comments: Pt was able to recall 3 items immediately but not after 3 mins, able to do so with clues.  Able to count months backwards but with increased time.  Pt acknowledges cognition not at baseline. Also, some mild L neglect (looks R)      General Comments General comments (skin integrity, edema, etc.): Facial droop on L, dysarthria but easily understood, mild L neglect. EOEM intact, visual fields intact but pt tends to turn head R. Pt educated on use of RW at d/c, supervision needed, and HH therapies.    Exercises     Assessment/Plan    PT Assessment Patient needs continued PT services  PT Problem List Decreased strength;Decreased mobility;Decreased safety awareness;Decreased activity tolerance;Decreased cognition;Decreased balance;Decreased knowledge of use of DME       PT Treatment Interventions DME instruction;Therapeutic activities;Gait training;Therapeutic exercise;Patient/family education;Stair training;Balance training;Functional mobility training;Neuromuscular re-education    PT Goals (Current goals can be found in the Care Plan section)  Acute Rehab PT Goals Patient Stated Goal: return to work PT Goal  Formulation: With patient Time For Goal Achievement: 12/29/20 Potential to Achieve Goals: Good Additional Goals Additional Goal #1: Pt will score > 19 on DGI for decreased fall risk    Frequency Min 4X/week   Barriers to discharge        Co-evaluation               AM-PAC PT "6 Clicks" Mobility  Outcome Measure Help needed turning from your back to your side while in a flat bed without using bedrails?: None Help needed moving from lying on your back to sitting on the side of a flat bed without using bedrails?: A Little Help needed moving to and from a bed to a chair (including a wheelchair)?: A Little Help needed standing up from a chair using your arms (e.g., wheelchair or bedside chair)?: A Little Help needed to walk in hospital room?: A Little Help needed climbing 3-5 steps with a railing? : A Little 6 Click Score: 19    End of Session Equipment Utilized During Treatment: Gait belt Activity Tolerance: Patient tolerated treatment well Patient left: with chair alarm set;in chair;with call bell/phone within reach Nurse Communication: Mobility status PT Visit Diagnosis: Other abnormalities of gait and mobility (R26.89);Muscle weakness (generalized) (M62.81);Hemiplegia and hemiparesis Hemiplegia - Right/Left: Left Hemiplegia - dominant/non-dominant: Dominant Hemiplegia - caused by: Cerebral infarction    Time: 9323-5573 PT Time Calculation (min) (ACUTE ONLY): 25 min   Charges:   PT Evaluation $PT Eval  Moderate Complexity: 1 Mod PT Treatments $Gait Training: 8-22 mins        Anise Salvo, PT Acute Rehab Services Pager [phone] [facility] [phone]    Rayetta Humphrey 12/15/2020, 5:23 PM

## 2020-12-15 NOTE — Progress Notes (Signed)
  Echocardiogram 2D Echocardiogram has been performed.  Gary Mayer 12/15/2020, 10:44 AM

## 2020-12-15 NOTE — Evaluation (Signed)
Occupational Therapy Evaluation Patient Details Name: Gary Mayer MRN: [id number] DOB: May 16, 1956 Today's Date: 12/15/2020    History of Present Illness Pt is a 65 year old man admitted with L side weakness and slurred speech on 12/14/20. 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 PMH: HTN with poor med compliance, CHF, CKD 3.   Clinical Impression   Pt was independent prior to admission. Reports he woke up with stroke symptoms, but did not seek treatment until that evening after his niece visited him. Pt presents with L side incoordination, impaired proprioception and mild strength deficits. He is emotionally labile. Pt requires up to min assist for ADL and mobility with RW. He needs to function at a modified independent level to discharge home as he does not have 24 hour assistance. Will follow acutely.    Follow Up Recommendations  Home health OT (progressing to OPOT)    Equipment Recommendations  3 in 1 bedside commode    Recommendations for Other Services       Precautions / Restrictions Precautions Precautions: Fall      Mobility Bed Mobility Overal bed mobility: Needs Assistance Bed Mobility: Supine to Sit     Supine to sit: Supervision     General bed mobility comments: increased time, assist to manage blankets    Transfers Overall transfer level: Needs assistance Equipment used: Rolling walker (2 wheeled) Transfers: Sit to/from [employer] Sit to Stand: Min guard Stand pivot transfers: Min assist       General transfer comment: cues for hand placement, steadying assist    Balance Overall balance assessment: Needs assistance   Sitting balance-Leahy Scale: Good Sitting balance - Comments: no LOB donning socks   Standing balance support: No upper extremity supported Standing balance-Leahy Scale: Fair Standing balance comment: statically                           ADL either  performed or assessed with clinical judgement   ADL Overall ADL's : Needs assistance/impaired Eating/Feeding: Minimal assistance;Sitting   Grooming: Min guard;Standing   Upper Body Bathing: Sitting;Minimal assistance   Lower Body Bathing: Minimal assistance;Sitting/lateral leans   Upper Body Dressing : Minimal assistance;Sitting   Lower Body Dressing: Minimal assistance;Sit to/from stand   Toilet Transfer: Minimal assistance;Ambulation;RW   Toileting- Clothing Manipulation and Hygiene: Minimal assistance;Sit to/from stand       Functional mobility during ADLs: Minimal assistance;Rolling walker;Cueing for safety;Cueing for sequencing       Vision Baseline Vision/History: No visual deficits Patient Visual Report: No change from baseline       Perception     Praxis      Pertinent Vitals/Pain Pain Assessment: No/denies pain     Hand Dominance Left   Extremity/Trunk Assessment Upper Extremity Assessment Upper Extremity Assessment: LUE deficits/detail LUE Deficits / Details: 4/5 strength LUE Sensation: decreased proprioception LUE Coordination: decreased fine motor;decreased gross motor   Lower Extremity Assessment Lower Extremity Assessment: Defer to PT evaluation   Cervical / Trunk Assessment Cervical / Trunk Assessment: Normal (obesity)   Communication Communication Communication: Expressive difficulties (easily understood)   Cognition Arousal/Alertness: Awake/alert Behavior During Therapy:  (emotional lability) Overall Cognitive Status: Impaired/Different from baseline Area of Impairment: Safety/judgement                         Safety/Judgement: Decreased awareness of deficits     General Comments:  pt unable to state how he is different from his baseline   General Comments       Exercises     Shoulder Instructions      Home Living Family/patient expects to be discharged to:: Private residence Living Arrangements: Alone Available  Help at Discharge: Family;Available PRN/intermittently Type of Home: House Home Access: Stairs to enter [employer] of Steps: 2+2 Entrance Stairs-Rails: None Home Layout: Two level;1/2 bath on main level Alternate Level Stairs-Number of Steps: flight   Bathroom Shower/Tub: Producer, television/film/video: Standard     Home Equipment: None          Prior Functioning/Environment Level of Independence: Independent        Comments: walks 5 miles a day, works as a Curator for [employer]        OT Problem List: Decreased strength;Impaired balance (sitting and/or standing);Decreased coordination;Decreased knowledge of use of DME or AE;Impaired UE functional use;Obesity      OT Treatment/Interventions: Self-care/ADL training;Neuromuscular education;DME and/or AE instruction;Patient/family education;Balance training;Therapeutic activities;Cognitive remediation/compensation    OT Goals(Current goals can be found in the care plan section) Acute Rehab OT Goals Patient Stated Goal: return to work OT Goal Formulation: With patient Time For Goal Achievement: 12/29/20 Potential to Achieve Goals: Good ADL Goals Pt Will Perform Eating: with modified independence;sitting Pt Will Perform Grooming: with modified independence;standing Pt Will Perform Upper Body Bathing: with modified independence;sitting Pt Will Perform Lower Body Bathing: with modified independence;sit to/from stand Pt Will Perform Upper Body Dressing: with modified independence;sitting Pt Will Perform Lower Body Dressing: with modified independence;sit to/from stand Pt Will Transfer to Toilet: with modified independence;ambulating;regular height toilet Pt Will Perform Toileting - Clothing Manipulation and hygiene: with modified independence;sit to/from stand Pt/caregiver will Perform Home Exercise Program: Increased strength;Left upper extremity;Independently  OT Frequency: Min 3X/week    Barriers to D/C: Decreased caregiver support          Co-evaluation              AM-PAC OT "6 Clicks" Daily Activity     Outcome Measure Help from another person eating meals?: A Little Help from another person taking care of personal grooming?: A Little Help from another person toileting, which includes using toliet, bedpan, or urinal?: A Little Help from another person bathing (including washing, rinsing, drying)?: A Little Help from another person to put on and taking off regular upper body clothing?: A Little Help from another person to put on and taking off regular lower body clothing?: A Little 6 Click Score: 18   End of Session Equipment Utilized During Treatment: Rolling walker  Activity Tolerance: Patient tolerated treatment well Patient left: in chair;with call bell/phone within reach;with chair alarm set  OT Visit Diagnosis: Unsteadiness on feet (R26.81);Other abnormalities of gait and mobility (R26.89);Muscle weakness (generalized) (M62.81);Hemiplegia and hemiparesis Hemiplegia - Right/Left: Left Hemiplegia - dominant/non-dominant: Dominant Hemiplegia - caused by: Cerebral infarction                Time: 6759-1638 OT Time Calculation (min): 29 min Charges:  OT General Charges $OT Visit: 1 Visit OT Evaluation $OT Eval Moderate Complexity: 1 Mod OT Treatments $Self Care/Home Management : 8-22 mins  Martie Round, OTR/L Acute Rehabilitation Services Pager: [phone] Office: [phone]  Evern Bio 12/15/2020, 3:46 PM

## 2020-12-15 NOTE — Progress Notes (Signed)
Patient received to the unit. Patient is alert and oriented x4. Iv in place. Skin assessment done with another nurse. No skin breakdown noted on examination. Given instructions about call bell and phone. Bed in low position and call bell in reach. 

## 2020-12-15 NOTE — ED Notes (Signed)
PT transported to Vascular

## 2020-12-15 NOTE — Progress Notes (Signed)
PROGRESS NOTE  Gary Mayer  DOB: [birth year]  PCP: Aviva Kluver HWE:[id number]  DOA: 12/14/2020  LOS: 1 day   Chief Complaint  Patient presents with  . Stroke like symptoms     Brief narrative: Gary Mayer is a 65 y.o. male with PMH significant for uncontrolled hypertension, noncompliance to meds, diastolic CHF, CKD 3B Patient presented to the ED on 5/3 with complaint of left-sided weakness.  5/3, patient woke up with difficulty using the left side of his body, slurred speech, facial asymmetry.  In neck several hours, patient also started to become less responsive and was not walking normally  In the ED, patient was extremely hypertensive to 248. MRI brain showed a small acute infarction in the right caudate tail/basal ganglia, no bleeding. Admitted to hospital service for stroke pathway Neurology consult was obtained.  Subjective: Patient was seen and examined this afternoon. sitting up in chair.  Language fluent, mild weakness in the left upper extremity.  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  He started crying and says 'I do not cry but I cannot control myself.' Chart reviewed Afebrile, heart rate in 60s, blood pressure was up to 180s overnight, 208/123 this morning  Assessment/Plan: Acute right-sided stroke -Presented with several hours of left-sided weakness, slurred speech, facial asymmetry and gait impairment -MRI finding as above showing small acute infarcts in the right carotid tail/basal ganglia.  Likely secondary to uncontrolled hypertension. -Stroke pathway initiated. -Pending echo, carotid duplex -A1c 5.7, LDL 131, HDL 56 -Neurology consult appreciated.  Recommended aspirin 81 mg daily and Plavix 75 daily for 3 weeks followed by aspirin only.  Also started on Lipitor 80 mg daily. -Pending PT/OT/ST eval.  Hypertensive emergency -History of uncontrolled hypertension, noncompliance to meds -Currently permissive hypertension allowed for systolic blood pressure up  to 220 -His blood pressure has been running persistently over 200 today.  As needed IV labetalol in place.  Chronic diastolic CHF -New echo pending  CKD 3b -Creatinine was 1.87 in June 2019 per care everywhere  -Present with a creatinine 1.77.  Continue to monitor.   Recent Labs    12/14/20 1818  BUN 17  CREATININE 1.77*   Mobility: PT eval pending Code Status:   Code Status: Full Code  Nutritional status: There is no height or weight on file to calculate BMI.     Diet Order            Diet Heart Room service appropriate? Yes; Fluid consistency: Thin  Diet effective now                 DVT prophylaxis: enoxaparin (LOVENOX) injection 40 mg Start: 12/15/20 1000   Antimicrobials:  None Fluid: None Consultants: Neurology Family Communication:  None at bedside  Status is: Inpatient  Remains inpatient appropriate because: Needs better blood pressure control  Dispo: The patient is from: Home              Anticipated d/c is to: Home in 1 to 2 days              Patient currently is not medically stable to d/c.   Difficult to place patient No     Infusions:    Scheduled Meds: .  stroke: mapping our early stages of recovery book   Does not apply Once  . aspirin EC  81 mg Oral Daily  . atorvastatin  40 mg Oral Daily  . clopidogrel  75 mg Oral Daily  . enoxaparin (LOVENOX) injection  40 mg  Subcutaneous Q24H  . multivitamin with minerals  1 tablet Oral Daily    Antimicrobials: Anti-infectives (From admission, onward)   None      PRN meds: acetaminophen **OR** acetaminophen (TYLENOL) oral liquid 160 mg/5 mL **OR** acetaminophen, labetalol, lidocaine   Objective: Vitals:   12/15/20 1428 12/15/20 1500  BP: (!) 204/119 (!) 211/111  Pulse: 78 81  Resp: 15 18  Temp: 98.9 F (37.2 C) 98.7 F (37.1 C)  SpO2: 98%    No intake or output data in the 24 hours ending 12/15/20 1555 There were no vitals filed for this visit. Weight change:  There is no height or  weight on file to calculate BMI.   Physical Exam: General exam: Pleasant middle-aged morbidly obese African-American male. Skin: No rashes, lesions or ulcers. HEENT: Atraumatic, normocephalic, no obvious bleeding Lungs: Clear to auscultation bilaterally bilaterally CVS: Regular rate and rhythm, no murmur GI/Abd soft, nontender, nondistended, bowel sound present CNS: Alert, awake, oriented x3, left facial droop present, mild weakness in the left upper extremity Psychiatry: Mood appropriate Extremities: No pedal edema, no calf tenderness  Data Review: I have personally reviewed the laboratory data and studies available.  Recent Labs  Lab 12/14/20 1818  WBC 6.1  NEUTROABS 4.2  HGB 14.8  HCT 44.5  MCV 87.1  PLT 192   Recent Labs  Lab 12/14/20 1818  NA 136  K 3.5  CL 104  CO2 24  GLUCOSE 112*  BUN 17  CREATININE 1.77*  CALCIUM 9.2    F/u labs ordered Unresulted Labs (From admission, onward)          Start     Ordered   12/16/20 0500  CBC with Differential/Platelet  Daily,   R      12/15/20 1555   12/16/20 0500  Basic metabolic panel  Daily,   R      12/15/20 1555          Signed, Lorin Glass, MD [facility] 12/15/2020

## 2020-12-15 NOTE — Progress Notes (Signed)
Carotid duplex has been completed.   Preliminary results in CV Proc.   Blanch Media 12/15/2020 9:17 AM

## 2020-12-15 NOTE — Progress Notes (Signed)
   12/15/20 1428 12/15/20 1500  Assess: MEWS Score  Temp 98.9 F (37.2 C) 98.7 F (37.1 C)  BP (!) 204/119 (!) 211/111  Pulse Rate 78 81  Resp 15 18  Level of Consciousness  --  Alert  SpO2 98 %  --   O2 Device Room Air  --   Assess: MEWS Score  MEWS Temp 0 0  MEWS Systolic 2 2  MEWS Pulse 0 0  MEWS RR 0 0  MEWS LOC 0 0  MEWS Score 2 2  MEWS Score Color Yellow Yellow  Assess: if the MEWS score is Yellow or Red  Were vital signs taken at a resting state? Yes  --   Focused Assessment No change from prior assessment  --   MEWS guidelines implemented *See Row Information* Yes  --   Treat  MEWS Interventions Other (Comment) (please see stroke core measure)  --   Pain Scale  --  0-10  Pain Score  --  0  Take Vital Signs  Increase Vital Sign Frequency   (please see stroke core measure, pt's systolic  b/p can be up 220. please see orders)  --   Escalate  MEWS: Escalate Yellow: discuss with charge nurse/RN and consider discussing with provider and RRT (please see stroke core measure)  --   Notify: Charge Nurse/RN  Name of Charge Nurse/RN Notified Havy RN  --   Date Charge Nurse/RN Notified 12/15/20  --   Time Charge Nurse/RN Notified 1435  --   Notify: Provider  Provider Name/Title MD Dahal Melina Schools  --   Date Provider Notified 12/15/20  --   Time Provider Notified 1430  --   Notification Type Face-to-face  --   Notification Reason Other (Comment) (High BP)  --   Provider response At bedside  --   Date of Provider Response 12/15/20  --   Time of Provider Response 1431  --   Document  Patient Outcome Other (Comment) (prn given per [facility])  --   Progress note created (see row info) Yes  --     Patient with yellow Mew score, please see above. Patient is also a new ischemic stroke. Per order and stroke protocol, systolic b/p can be up to 220 (see nursing order).Patient appears in no distress, denied pain, HA, or dz. Will cont to monitor.

## 2020-12-16 LAB — CBC WITH DIFFERENTIAL/PLATELET
Abs Immature Granulocytes: 0.02 10*3/uL (ref 0.00–0.07)
Basophils Absolute: 0 10*3/uL (ref 0.0–0.1)
Basophils Relative: 1 %
Eosinophils Absolute: 0.1 10*3/uL (ref 0.0–0.5)
Eosinophils Relative: 2 %
HCT: 40.1 % (ref 39.0–52.0)
Hemoglobin: 13.5 g/dL (ref 13.0–17.0)
Immature Granulocytes: 0 %
Lymphocytes Relative: 27 %
Lymphs Abs: 1.5 10*3/uL (ref 0.7–4.0)
MCH: 29.5 pg (ref 26.0–34.0)
MCHC: 33.7 g/dL (ref 30.0–36.0)
MCV: 87.6 fL (ref 80.0–100.0)
Monocytes Absolute: 0.6 10*3/uL (ref 0.1–1.0)
Monocytes Relative: 10 %
Neutro Abs: 3.3 10*3/uL (ref 1.7–7.7)
Neutrophils Relative %: 60 %
Platelets: 160 10*3/uL (ref 150–400)
RBC: 4.58 MIL/uL (ref 4.22–5.81)
RDW: 13.5 % (ref 11.5–15.5)
WBC: 5.6 10*3/uL (ref 4.0–10.5)
nRBC: 0 % (ref 0.0–0.2)

## 2020-12-16 LAB — BASIC METABOLIC PANEL
Anion gap: 6 (ref 5–15)
BUN: 19 mg/dL (ref 8–23)
CO2: 28 mmol/L (ref 22–32)
Calcium: 9.2 mg/dL (ref 8.9–10.3)
Chloride: 104 mmol/L (ref 98–111)
Creatinine, Ser: 1.77 mg/dL — ABNORMAL HIGH (ref 0.61–1.24)
GFR, Estimated: 43 mL/min — ABNORMAL LOW (ref >60)
Glucose, Bld: 103 mg/dL — ABNORMAL HIGH (ref 70–99)
Potassium: 3.9 mmol/L (ref 3.5–5.1)
Sodium: 138 mmol/L (ref 135–145)

## 2020-12-16 MED ORDER — AMLODIPINE BESYLATE 5 MG PO TABS
5.0000 mg | ORAL_TABLET | Freq: Every day | ORAL | Status: DC
  Administered 2020-12-16: 5 mg via ORAL
  Filled 2020-12-16: qty 1

## 2020-12-16 NOTE — Progress Notes (Signed)
Physical Therapy Treatment Patient Details Name: Gary Mayer MRN: [id number] DOB: Dec 19, 1955 Today's Date: 12/16/2020    History of Present Illness Pt is a 65 year old man admitted with L side weakness and slurred speech on 12/14/20. 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 PMH: HTN with poor med compliance, CHF, CKD 3.    PT Comments    Pt up in chair upon arrival to room, impulsively stands upon PT arrival without warning. Pt ambulatory in hallway with and without RW, pt balance improved without RW as pt lifts RW off ground multiple times and is more unsteady using it. Pt continues to present with min L inattention, especially when distracted (talking and walking). PT cuing pt to attend to L, and pay attention to LUE placement especially during step navigation with L railing. Pt lacks insight into deficits overall, feels he can return to work at d/c. PT advised pt to take his time healing and with therapies. PT to continue to follow acutely.     Follow Up Recommendations  Home health PT;Supervision for mobility/OOB (progress to outpt PT)     Equipment Recommendations  None recommended by PT    Recommendations for Other Services       Precautions / Restrictions Precautions Precautions: Fall Precaution Comments: L inattention Restrictions Weight Bearing Restrictions: No    Mobility  Bed Mobility               General bed mobility comments: in chair    Transfers Overall transfer level: Modified independent   Transfers: Sit to/from Stand Sit to Stand: Supervision         General transfer comment: for safety, rises quickly without warning  Ambulation/Gait Ambulation/Gait assistance: Min guard;Supervision Gait Distance (Feet): 700 Feet Assistive device: Rolling walker (2 wheeled);None Gait Pattern/deviations: Step-through pattern;Decreased dorsiflexion - left;Drifts right/left Gait velocity: decr   General  Gait Details: close guard for safety, balance worse with RW as pt picking RW up and lifting it completely during turns. Verbal cuing to attend to L side environment, as pt bumping into objects on L without acknwledgement   Stairs Stairs: Yes Stairs assistance: Min guard Stair Management: One rail Right;Alternating pattern;Step to pattern;Forwards Number of Stairs: 12 (x2) General stair comments: close guard for safety, verbal cuing for sequencing with step-to gait although during ascent pt opting for step-through, placing entire L foot on step during steps, sequencing LUE on railing during descent and requires step-by-step cuing for this.   Wheelchair Mobility    Modified Rankin (Stroke Patients Only)       Balance Overall balance assessment: Needs assistance   Sitting balance-Leahy Scale: Good     Standing balance support: No upper extremity supported Standing balance-Leahy Scale: Fair                              Cognition Arousal/Alertness: Awake/alert Behavior During Therapy: WFL for tasks assessed/performed;Impulsive (history of emotional lability today - PT overheard pt crying loudly ~10 minutes prior to PT arrival) Overall Cognitive Status: Impaired/Different from baseline Area of Impairment: Safety/judgement;Following commands                         Safety/Judgement: Decreased awareness of safety;Decreased awareness of deficits     General Comments: Pt demonstrating working memory deficits, cannot perform mobility tasks and talk to PT at the same time without marked  deficits. Pt states "I need to go back to work as soon as possible, as soon as I get back to dc", does not understand current deficits.      Exercises      General Comments General comments (skin integrity, edema, etc.): L facial droop, mild L inattention.      Pertinent Vitals/Pain Pain Assessment: No/denies pain    Home Living Family/patient expects to be discharged to::  Private residence Living Arrangements: Alone                  Prior Function            PT Goals (current goals can now be found in the care plan section) Acute Rehab PT Goals Patient Stated Goal: return to work PT Goal Formulation: With patient Time For Goal Achievement: 12/29/20 Potential to Achieve Goals: Good Progress towards PT goals: Progressing toward goals    Frequency    Min 4X/week      PT Plan Current plan remains appropriate    Co-evaluation              AM-PAC PT "6 Clicks" Mobility   Outcome Measure  Help needed turning from your back to your side while in a flat bed without using bedrails?: None Help needed moving from lying on your back to sitting on the side of a flat bed without using bedrails?: None Help needed moving to and from a bed to a chair (including a wheelchair)?: A Little Help needed standing up from a chair using your arms (e.g., wheelchair or bedside chair)?: None Help needed to walk in hospital room?: A Little Help needed climbing 3-5 steps with a railing? : A Little 6 Click Score: 21    End of Session Equipment Utilized During Treatment: Gait belt Activity Tolerance: Patient tolerated treatment well Patient left: with chair alarm set;in chair;with call bell/phone within reach Nurse Communication: Mobility status PT Visit Diagnosis: Other abnormalities of gait and mobility (R26.89);Muscle weakness (generalized) (M62.81);Hemiplegia and hemiparesis Hemiplegia - Right/Left: Left Hemiplegia - dominant/non-dominant: Dominant Hemiplegia - caused by: Cerebral infarction     Time: 1122-1139 PT Time Calculation (min) (ACUTE ONLY): 17 min  Charges:  $Gait Training: 8-22 mins                     Marye Round, PT DPT Acute Rehabilitation Services Pager [phone]  Office [phone]    Tyrone Apple E Christain [place] 12/16/2020, 4:05 PM

## 2020-12-16 NOTE — TOC Initial Note (Addendum)
Transition of Care [facility]) - Initial/Assessment Note    Patient Details  Name: Gary Mayer MRN: [id number] Date of Birth: 10-22-1955  [facility] CM/SW Contact:    Kingsley Plan, RN Phone Number: 12/16/2020, 10:58 AM  Clinical Narrative:                  Spoke to patient at bedside. Confirmed face sheet information. Patient plans to return to [facility] at discharge. Patient's PCP is Dr Debarah Crape at [facility] [phone] in [place]. Patient consented for NCM to call and schedule follow up appointment . Called and first available for hospital follow up is January 13, 2021 at 1:40 pm.   Discussed PT / SP, OT recommendations for [facility] however patient may progress to OP. Patient prefers home health.   Spoke to Center with [facility], they do not accept patient's insurance.  Called [facility] they cannot accept no reason given.   [facility] spoke to Windell Moulding they have no staffing to accept patient.  Called [facility] in [facility] they are not in network with patients insurance.   Called [facility] [phone] and left message. Called back they are requesting referral fax to them for review at [phone] same done. [facility] called back and declined referral.   [facility] spoke to [place] they do not take patient's insurance.   [facility] [phone] spoke to [place] they only accept [facility] [phone] spoke to [place] they are not accepting patient's insurance at this time   Called Dr Terri Skains office back to see which home health agency the normally use, advised to call [facility] [phone]. Called spoke to Juanda Bond requesting referral be faxed to them to review at [phone]. Alinda Money advised NCM to indicate on cover sheet expedite determination ( normally they have 48 hours to make a determination) and also ask them if they cannot staff referral  for them to forward referral to another agency.  Fax sent     Jackelyn Knife with [facility] and order tub bench and walker   Expected Discharge Plan: Home w Home Health Services     Patient Goals and CMS Choice Patient states their goals for this hospitalization and ongoing recovery are:: to return to home CMS Medicare.gov Compare Post Acute Care list provided to:: Patient Choice offered to / list presented to : Patient  Expected Discharge Plan and Services Expected Discharge Plan: Home w Home Health Services   Discharge Planning Services: CM Consult Post Acute Care Choice: Home Health,Durable Medical Equipment Living arrangements for the past 2 months: Single Family Home                 DME Arranged: Tub bench,Walker rolling DME Agency: [facility] Date DME Agency Contacted: 12/16/20 Time DME Agency Contacted: [phone]              Prior Living Arrangements/Services Living arrangements for the past 2 months: Single Family Home Lives with:: Self Patient language and need for interpreter reviewed:: Yes        Need for Family Participation in Patient Care: Yes (Comment) Care giver support system in place?: Yes (comment)   Criminal Activity/Legal Involvement Pertinent to Current Situation/Hospitalization: No - Comment as needed  Activities of Daily Living      Permission Sought/Granted   Permission granted to  share information with : No              Emotional Assessment Appearance:: Appears stated age Attitude/Demeanor/Rapport: Engaged Affect (typically observed): Accepting Orientation: : Oriented to Self,Oriented to Place,Oriented to  Time,Oriented to Situation Alcohol / Substance Use: Not Applicable Psych Involvement: No (comment)  Admission diagnosis:  Acute ischemic stroke [facility]) [I63.9] Cerebrovascular accident (CVA), unspecified mechanism (HCC) [I63.9] Patient Active Problem List   Diagnosis Date Noted  . Acute ischemic stroke (HCC) 12/14/2020  .  HTN (hypertension) 12/14/2020  . CKD (chronic kidney disease) stage 3, GFR 30-59 ml/min (HCC) 12/14/2020   PCP:  Pcp, No Pharmacy:   [facility] [phone] [place] - [address] [place] [address] Phone: [phone] Fax: [phone]     Social Determinants of Health (SDOH) Interventions    Readmission Risk Interventions No flowsheet data found.

## 2020-12-16 NOTE — Progress Notes (Signed)
Patient ID: Gary Mayer, male   DOB: [birth year], 65 y.o.   MRN: [id number]  PROGRESS NOTE    Gary Mayer  HUT:[id number] DOB: 06/27/1956 DOA: 12/14/2020 PCP: Pcp, No    Brief Narrative:  Gary Mayer is a 65 y.o. male with PMH significant for uncontrolled hypertension, noncompliance to meds, diastolic CHF, CKD 3B Patient presented to the ED on 5/3 with complaint of left-sided weakness.  5/3, patient woke up with difficulty using the left side of his body, slurred speech, facial asymmetry.  In neck several hours, patient also started to become less responsive and was not walking normally  In the ED, patient was extremely hypertensive to 248. MRI brain showed a small acute infarction in the right caudate tail/basal ganglia, no bleeding. Admitted to hospital service for stroke pathway Neurology consult was obtained.    Assessment & Plan:   Principal Problem:   Acute ischemic stroke (HCC) Active Problems:   HTN (hypertension)   CKD (chronic kidney disease) stage 3, GFR 30-59 ml/min (HCC)   Acute right-sided stroke -Presented with several hours of left-sided weakness, slurred speech, facial asymmetry and gait impairment -MRI finding as above showing small acute infarcts in the right carotid tail/basal ganglia.  Likely secondary to uncontrolled hypertension. -Stroke pathway initiated. -Normal echo, normal carotid duplex -A1c 5.7, LDL 131, HDL 56 -Neurology consult appreciated.  Recommended aspirin 81 mg daily and Plavix 75 daily for 3 weeks followed by aspirin only.  Also started on Lipitor 80 mg daily. -PT/OT/ST eval.  Hypertensive emergency -History of uncontrolled hypertension, noncompliance to meds -Currently permissive hypertension allowed for systolic blood pressure up to 656 -His blood pressure has been running persistently over 200 today.  As needed IV labetalol in place. Resume Norvasc at half dose will increase to 10 tomorrow  Chronic diastolic CHF -New echo  pending  CKD 3b -Creatinine was 1.87 in June 2019 per care everywhere  -Present with a creatinine 1.77.  Continue to monitor.   DVT prophylaxis: Lovenox SQ Code Status: Full code  Family Communication: Patient at bedside Disposition Plan: Home with home PT   Consultants:   Neurology  Procedures:  Carotid Dopplers  Cardiac echo  Antimicrobials: Anti-infectives (From admission, onward)   None       Subjective: Feels well today.  Was up with PT and knows he needs to continue to work on this.  Discussed his lack of medications which he felt he did not need but now he knows better.  Objective: Vitals:   12/16/20 0054 12/16/20 0529 12/16/20 0813 12/16/20 1209  BP: (!) 178/102 (!) 185/106 (!) 181/95 (!) 179/92  Pulse: 65 67 64 77  Resp: 18 18 18 18   Temp: 98.4 F (36.9 C) 98.6 F (37 C) 98.4 F (36.9 C) 98.3 F (36.8 C)  TempSrc: Oral Oral Oral Oral  SpO2: 95% 96% 96% 99%    Intake/Output Summary (Last 24 hours) at 12/16/2020 1653 Last data filed at 12/16/2020 0536 Gross per 24 hour  Intake --  Output 900 ml  Net -900 ml   There were no vitals filed for this visit.  Examination:  General exam: Appears calm and comfortable  Respiratory system: Clear to auscultation. Respiratory effort normal. Cardiovascular system: S1 & S2 heard, RRR.  Gastrointestinal system: Abdomen is nondistended, soft and nontender.  Central nervous system: Alert and oriented. No focal neurological deficits. Extremities: Left upper extremity weakness Skin: No rashes Psychiatry: Judgement and insight appear normal. Mood & affect appropriate.     Data Reviewed:  I have personally reviewed following labs and imaging studies  CBC: Recent Labs  Lab 12/14/20 1818 12/16/20 0242  WBC 6.1 5.6  NEUTROABS 4.2 3.3  HGB 14.8 13.5  HCT 44.5 40.1  MCV 87.1 87.6  PLT 192 160   Basic Metabolic Panel: Recent Labs  Lab 12/14/20 1818 12/16/20 0242  NA 136 138  K 3.5 3.9  CL 104 104   CO2 24 28  GLUCOSE 112* 103*  BUN 17 19  CREATININE 1.77* 1.77*  CALCIUM 9.2 9.2   GFR: CrCl cannot be calculated (Unknown ideal weight.). Liver Function Tests: Recent Labs  Lab 12/14/20 1818  AST 32  ALT 25  ALKPHOS 124  BILITOT 0.9  PROT 7.4  ALBUMIN 4.1   HbA1C: Recent Labs    12/15/20 0403  HGBA1C 5.7*   Lipid Profile: Recent Labs    12/15/20 0403  CHOL 193  HDL 56  LDLCALC 131*  TRIG 32  CHOLHDL 3.4     Recent Results (from the past 240 hour(s))  SARS CORONAVIRUS 2 (TAT 6-24 HRS) Nasopharyngeal Nasopharyngeal Swab     Status: None   Collection Time: 12/14/20  8:44 PM   Specimen: Nasopharyngeal Swab  Result Value Ref Range Status   SARS Coronavirus 2 NEGATIVE NEGATIVE Final    Comment: (NOTE) SARS-CoV-2 target nucleic acids are NOT DETECTED.  The SARS-CoV-2 RNA is generally detectable in upper and lower respiratory specimens during the acute phase of infection. Negative results do not preclude SARS-CoV-2 infection, do not rule out co-infections with other pathogens, and should not be used as the sole basis for treatment or other patient management decisions. Negative results must be combined with clinical observations, patient history, and epidemiological information. The expected result is Negative.  Fact Sheet for Patients: HairSlick.nohttps://www.fda.gov/media/138098/download  Fact Sheet for Healthcare Providers: quierodirigir.comhttps://www.fda.gov/media/138095/download  This test is not yet approved or cleared by the [place] FDA and  has been authorized for detection and/or diagnosis of SARS-CoV-2 by FDA under an Emergency Use Authorization (EUA). This EUA will remain  in effect (meaning this test can be used) for the duration of the COVID-19 declaration under Se ction 564(b)(1) of the Act, 21 U.S.C. section 360bbb-3(b)(1), unless the authorization is terminated or revoked sooner.  Performed at [facility], [address] [place], [place] [address]        Radiology Studies: CT Head Wo Contrast  Result Date: 12/14/2020 CLINICAL DATA:  Weakness hypertension EXAM: CT HEAD WITHOUT CONTRAST TECHNIQUE: Contiguous axial images were obtained from the base of the skull through the vertex without intravenous contrast. COMPARISON:  None. FINDINGS: Brain: No acute territorial infarction, hemorrhage or intracranial mass. Advanced hypodensity in the white matter consistent with chronic small vessel ischemic change. Age indeterminate lacunar infarcts within the left basal ganglia/white matter, right thalamus and pons. Nonenlarged ventricles Vascular: No hyperdense vessels.  No unexpected calcification. Skull: Normal. Negative for fracture or focal lesion. Sinuses/Orbits: No acute finding. Other: None IMPRESSION: 1. Advanced chronic small vessel ischemic change of white matter. Age indeterminate lacunar infarcts within the left basal ganglia, left white matter, right thalamus and pons. 2. Negative for hemorrhage or intracranial mass Electronically Signed   By: Jasmine PangKim  Fujinaga M.D.   On: 12/14/2020 19:56   MR ANGIO HEAD WO CONTRAST  Result Date: 12/14/2020 CLINICAL DATA:  Stroke-like symptoms EXAM: MRA HEAD WITHOUT CONTRAST TECHNIQUE: Angiographic images of the Circle of Willis were obtained using MRA technique without intravenous contrast. COMPARISON:  None. FINDINGS: POSTERIOR CIRCULATION: --Basilar artery:  Normal. --Superior cerebellar arteries: Normal. --Posterior cerebral arteries: Normal. ANTERIOR CIRCULATION: --Intracranial internal carotid arteries: Normal. --Anterior cerebral arteries (ACA): Normal. Hypoplastic right A1 segment, normal variant. --Middle cerebral arteries (MCA): Normal. IMPRESSION: Normal intracranial MRA. Electronically Signed   By: Deatra Robinson M.D.   On: 12/14/2020 22:23   MR Brain Wo Contrast (neuro protocol)  Result Date: 12/14/2020 CLINICAL DATA:  Stroke-like symptoms EXAM: MRI HEAD WITHOUT CONTRAST TECHNIQUE: Multiplanar, multiecho  pulse sequences of the brain and surrounding structures were obtained without intravenous contrast. COMPARISON:  None. FINDINGS: Brain: Small acute infarct of the right caudate tail. No acute or chronic hemorrhage. Confluent hyperintense T2-weighted white matter signal. Generalized volume loss without a clear lobar predilection. There are multiple old small vessel infarcts of the brainstem and deep gray nuclei. The midline structures are normal. Vascular: Major flow voids are preserved. Skull and upper cervical spine: Normal calvarium and skull base. Visualized upper cervical spine and soft tissues are normal. Sinuses/Orbits:No paranasal sinus fluid levels or advanced mucosal thickening. No mastoid or middle ear effusion. Normal orbits. IMPRESSION: 1. Small acute infarct of the right caudate tail. No hemorrhage or mass effect. 2. Multiple old small vessel infarcts of the brainstem and deep gray nuclei. Electronically Signed   By: Deatra Robinson M.D.   On: 12/14/2020 22:10   DG Chest Portable 1 View  Result Date: 12/14/2020 CLINICAL DATA:  Altered mental status EXAM: PORTABLE CHEST 1 VIEW COMPARISON:  None. FINDINGS: The heart size and mediastinal contours are within normal limits. Both lungs are clear. The visualized skeletal structures are unremarkable. IMPRESSION: No active disease. Electronically Signed   By: Jasmine Pang M.D.   On: 12/14/2020 19:56   ECHOCARDIOGRAM COMPLETE  Result Date: 12/15/2020    ECHOCARDIOGRAM REPORT   Patient Name:   GURKIRAT BASHER Date of Exam: 12/15/2020 Medical Rec #:  [id number]     Height: Accession #:    [id number]    Weight: Date of Birth:  1955/12/08    BSA: Patient Age:    64 years      BP:           208/123 mmHg Patient Gender: M             HR:           80 bpm. Exam Location:  Inpatient Procedure: 2D Echo, Cardiac Doppler and Color Doppler Indications:    CVA  History:        Patient has no prior history of Echocardiogram examinations.                 CHF, Stroke; Risk  Factors:Hypertension and CKD.  Sonographer:    Lavenia Atlas Referring Phys: [phone] JARED M GARDNER IMPRESSIONS  1. Left ventricular ejection fraction, by estimation, is 60 to 65%. The left ventricle has normal function. The left ventricle has no regional wall motion abnormalities. There is mild left ventricular hypertrophy. Left ventricular diastolic parameters were normal.  2. Right ventricular systolic function is normal. The right ventricular size is normal.  3. Left atrial size was mildly dilated.  4. The mitral valve is normal in structure. No evidence of mitral valve regurgitation. No evidence of mitral stenosis.  5. The aortic valve is tricuspid. There is mild calcification of the aortic valve. Aortic valve regurgitation is not visualized. Mild aortic valve sclerosis is present, with no evidence of aortic valve stenosis.  6. The inferior vena cava is normal in size with greater than 50% respiratory variability, suggesting right atrial  pressure of 3 mmHg. FINDINGS  Left Ventricle: Left ventricular ejection fraction, by estimation, is 60 to 65%. The left ventricle has normal function. The left ventricle has no regional wall motion abnormalities. The left ventricular internal cavity size was normal in size. There is  mild left ventricular hypertrophy. Left ventricular diastolic parameters were normal. Right Ventricle: The right ventricular size is normal. No increase in right ventricular wall thickness. Right ventricular systolic function is normal. Left Atrium: Left atrial size was mildly dilated. Right Atrium: Right atrial size was normal in size. Pericardium: There is no evidence of pericardial effusion. Mitral Valve: The mitral valve is normal in structure. No evidence of mitral valve regurgitation. No evidence of mitral valve stenosis. Tricuspid Valve: The tricuspid valve is normal in structure. Tricuspid valve regurgitation is trivial. No evidence of tricuspid stenosis. Aortic Valve: The aortic valve is  tricuspid. There is mild calcification of the aortic valve. Aortic valve regurgitation is not visualized. Mild aortic valve sclerosis is present, with no evidence of aortic valve stenosis. Pulmonic Valve: The pulmonic valve was normal in structure. Pulmonic valve regurgitation is not visualized. No evidence of pulmonic stenosis. Aorta: The aortic root is normal in size and structure. Venous: The inferior vena cava is normal in size with greater than 50% respiratory variability, suggesting right atrial pressure of 3 mmHg. IAS/Shunts: No atrial level shunt detected by color flow Doppler.  LEFT VENTRICLE PLAX 2D LVIDd:         4.70 cm  Diastology LVIDs:         2.90 cm  LV e' medial:    5.11 cm/s LV PW:         1.40 cm  LV E/e' medial:  12.7 LV IVS:        1.20 cm  LV e' lateral:   7.18 cm/s LVOT diam:     2.30 cm  LV E/e' lateral: 9.1 LV SV:         59 LVOT Area:     4.15 cm  RIGHT VENTRICLE RV Basal diam:  2.60 cm RV S prime:     5.87 cm/s TAPSE (M-mode): 2.8 cm LEFT ATRIUM             RIGHT ATRIUM LA diam:        4.10 cm RA Area:     11.40 cm LA Vol (A2C):   35.2 ml RA Volume:   24.70 ml LA Vol (A4C):   42.9 ml LA Biplane Vol: 39.3 ml  AORTIC VALVE LVOT Vmax:   79.80 cm/s LVOT Vmean:  46.200 cm/s LVOT VTI:    0.142 m  AORTA Ao Root diam: 3.20 cm MITRAL VALVE MV Area (PHT): 4.39 cm    SHUNTS MV Decel Time: 173 msec    Systemic VTI:  0.14 m MV E velocity: 65.10 cm/s  Systemic Diam: 2.30 cm MV A velocity: 63.80 cm/s MV E/A ratio:  1.02 Charlton Haws MD Electronically signed by Charlton Haws MD Signature Date/Time: 12/15/2020/10:58:32 AM    Final    VAS US CAROTID (at [facility] and [facility] only)  Result Date: 12/16/2020 Carotid Arterial Duplex Study Patient Name:  PRAVIN PEREZPEREZ  Date of Exam:   12/15/2020 Medical Rec #: [id number]      Accession #:    [id number] Date of Birth: 1956-05-03     Patient Gender: M Patient Age:   64Y Exam Location:  [facility] Procedure:      VAS US CAROTID Referring Phys: [id number] Heywood Iles GARDNER  --------------------------------------------------------------------------------  Indications:       CVA. Risk Factors:      Hypertension. Comparison Study:  no prior Performing Technologist: Blanch Media RVS  Examination Guidelines: A complete evaluation includes B-mode imaging, spectral Doppler, color Doppler, and power Doppler as needed of all accessible portions of each vessel. Bilateral testing is considered an integral part of a complete examination. Limited examinations for reoccurring indications may be performed as noted.  Right Carotid Findings: +----------+--------+--------+--------+------------------+--------+           PSV cm/sEDV cm/sStenosisPlaque DescriptionComments +----------+--------+--------+--------+------------------+--------+ CCA Prox  109     22              heterogenous               +----------+--------+--------+--------+------------------+--------+ CCA Distal104     14              heterogenous               +----------+--------+--------+--------+------------------+--------+ ICA Prox  49      10      1-39%   heterogenous               +----------+--------+--------+--------+------------------+--------+ ICA Distal37      9                                          +----------+--------+--------+--------+------------------+--------+ ECA       150     22                                         +----------+--------+--------+--------+------------------+--------+ +----------+--------+-------+--------+-------------------+           PSV cm/sEDV cmsDescribeArm Pressure (mmHG) +----------+--------+-------+--------+-------------------+ [id number]                                         +----------+--------+-------+--------+-------------------+ +---------+--------+--+--------+--+---------+ VertebralPSV cm/s40EDV cm/s10Antegrade +---------+--------+--+--------+--+---------+  Left Carotid Findings:  +----------+--------+--------+--------+------------------+--------+           PSV cm/sEDV cm/sStenosisPlaque DescriptionComments +----------+--------+--------+--------+------------------+--------+ CCA Prox  98      20              heterogenous               +----------+--------+--------+--------+------------------+--------+ CCA Distal101     36              heterogenous               +----------+--------+--------+--------+------------------+--------+ ICA Prox  78      28      1-39%   heterogenous               +----------+--------+--------+--------+------------------+--------+ ICA Distal83      20                                         +----------+--------+--------+--------+------------------+--------+ ECA       161     22                                         +----------+--------+--------+--------+------------------+--------+ +----------+--------+--------+--------+-------------------+  PSV cm/sEDV cm/sDescribeArm Pressure (mmHG) +----------+--------+--------+--------+-------------------+ Subclavian79                                          +----------+--------+--------+--------+-------------------+ +---------+--------+--+--------+--+---------+ VertebralPSV cm/s27EDV cm/s13Antegrade +---------+--------+--+--------+--+---------+   Summary: Right Carotid: Velocities in the right ICA are consistent with a 1-39% stenosis. Left Carotid: Velocities in the left ICA are consistent with a 1-39% stenosis. Vertebrals: Bilateral vertebral arteries demonstrate antegrade flow. *See table(s) above for measurements and observations.  Electronically signed by Delia Heady MD on 12/16/2020 at 8:15:12 AM.    Final      Scheduled Meds: .  stroke: mapping our early stages of recovery book   Does not apply Once  . amLODipine  5 mg Oral Daily  . aspirin EC  81 mg Oral Daily  . atorvastatin  40 mg Oral Daily  . clopidogrel  75 mg Oral Daily  . enoxaparin  (LOVENOX) injection  40 mg Subcutaneous Q24H  . multivitamin with minerals  1 tablet Oral Daily   Continuous Infusions:   LOS: 2 days    Reva Bores, MD 12/16/2020 4:53 PM [phone] [facility] If 7PM-7AM, please contact night-coverage 12/16/2020, 4:53 PM

## 2020-12-16 NOTE — Evaluation (Addendum)
Speech Language Pathology Evaluation Patient Details Name: Gary Mayer MRN: [id number] DOB: [birth year] Today's Date: 12/16/2020 Time: 0940-1000 SLP Time Calculation (min) (ACUTE ONLY): 20 min  Problem List:  Patient Active Problem List   Diagnosis Date Noted  . Acute ischemic stroke (HCC) 12/14/2020  . HTN (hypertension) 12/14/2020  . CKD (chronic kidney disease) stage 3, GFR 30-59 ml/min (HCC) 12/14/2020   Past Medical History:  Past Medical History:  Diagnosis Date  . CKD (chronic kidney disease) stage 3, GFR 30-59 ml/min (HCC)   . Diastolic CHF (HCC)   . Hypertension   . Kidney stone    Past Surgical History: History reviewed. No pertinent surgical history. HPI:  Pt is a 65 year old man admitted with L side weakness and slurred speech on 12/14/20. 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 PMH: HTN with poor med compliance, CHF, CKD 3.   Assessment / Plan / Recommendation Clinical Impression  Pt presents with novel cognitive deficits appearing mild to mild/moderate in nature and dysarthria of speech s/p CVA. Emotional lability noted at beginning of session, pt able to redirect well. PLOF pt very high functioning was previously working full time as a Curator for [employer]. He was living [place] in [place] area but has supportive family with one sister in the area. Pt able to self report changes in cognition when asked, including slower processing, reduced recall, and changees with speech including mild slurring of speech. Pt also exhibited deficits in executive function skills, left inattention, mental manipulation. Pt was highly intelligible during communication however has moments of reduced articulation of sounds intermittently impairing intelligibility. Receptive and expressive language skills appeared intact. Continued SLP services indicated for deficits noted above.    SLP Assessment  SLP Recommendation/Assessment:  Patient needs continued Speech Lanaguage Pathology Services SLP Visit Diagnosis: Dysarthria and anarthria (R47.1);Cognitive communication deficit (R41.841)    Follow Up Recommendations  Home health SLP    Frequency and Duration min 1 x/week  1 week      SLP Evaluation Cognition  Overall Cognitive Status: Impaired/Different from baseline Arousal/Alertness: Awake/alert Orientation Level: Oriented to person;Oriented to place;Oriented to situation (disoriented to day of the month) Attention: Focused;Alternating Focused Attention: Appears intact Alternating Attention: Impaired Alternating Attention Impairment: Verbal complex;Functional complex Memory: Impaired Memory Impairment: Decreased short term memory;Decreased recall of new information Problem Solving: Impaired Problem Solving Impairment: Verbal complex;Functional complex Executive Function: Organizing;Self Monitoring Organizing: Impaired Organizing Impairment: Verbal complex;Functional complex Self Monitoring: Impaired Self Monitoring Impairment: Verbal complex;Functional complex Behaviors: Lability Safety/Judgment: Impaired       Comprehension  Auditory Comprehension Overall Auditory Comprehension: Appears within functional limits for tasks assessed Visual Recognition/Discrimination Discrimination:  (reports some double vision with words during reading tasks)    Expression Expression Primary Mode of Expression: Verbal Verbal Expression Overall Verbal Expression: Appears within functional limits for tasks assessed Written Expression Dominant Hand: Left   Oral / Motor  Oral Motor/Sensory Function Overall Oral Motor/Sensory Function: Mild impairment Facial ROM: Reduced left;Suspected CN VII (facial) dysfunction Facial Symmetry: Abnormal symmetry left;Suspected CN VII (facial) dysfunction Lingual ROM: Within Functional Limits Motor Speech Overall Motor Speech: Impaired Respiration: Within functional  limits Phonation: Normal Resonance: Within functional limits Articulation: Impaired Level of Impairment: Word Intelligibility: Intelligibility reduced Word: 75-100% accurate Phrase: 75-100% accurate Sentence: 50-74% accurate Conversation: 50-74% accurate Motor Planning: Witnin functional limits Effective Techniques: Slow rate;Over-articulate;Pause;Increased vocal intensity   GO  Ardyth Gal MA, CCC-SLP Acute Rehabilitation Services   12/16/2020, 10:24 AM

## 2020-12-16 NOTE — Progress Notes (Signed)
Pt's PCP ([facility]/Dr. Rachel Bo) called to speak to attending. The office was given Dr. Kenney Houseman Pratt's number to speak to her.   Dr. Terri Skains number: [phone]

## 2020-12-16 NOTE — Progress Notes (Signed)
Occupational Therapy Treatment Patient Details Name: Gary Mayer MRN: [id number] DOB: [birth year] Today's Date: 12/16/2020    History of present illness Pt is a 65 year old man admitted with L side weakness and slurred speech on 12/14/20. 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 PMH: HTN with poor med compliance, CHF, CKD 3.   OT comments  Pt without emotional lability today. Demonstrating L inattention with mobilizing around room and to bathroom, bumping into obstacles, but without overt LOB. Educated pt on implications of L inattention and compensatory strategies. Demonstrating improved L UE coordination with brushing hair, washing hands. Needing assistance to open toothpaste, but able to manage deodorant. Pt needing cues to lead with L hand at times with wide range reaching.   Follow Up Recommendations  Home health OT (progressing to OP)    Equipment Recommendations  Tub seat    Recommendations for Other Services      Precautions / Restrictions Precautions Precautions: Fall Precaution Comments: L inattention       Mobility Bed Mobility               General bed mobility comments: in chair    Transfers Overall transfer level: Modified independent Equipment used: None             General transfer comment: from chair and toilet    Balance Overall balance assessment: Needs assistance   Sitting balance-Leahy Scale: Good     Standing balance support: No upper extremity supported Standing balance-Leahy Scale: Fair                             ADL either performed or assessed with clinical judgement   ADL Overall ADL's : Needs assistance/impaired     Grooming: Oral care;Standing;Wash/dry hands;Minimal assistance Grooming Details (indicate cue type and reason): min assist to open toothpaste, pt with LOB toward L and back             Lower Body Dressing: Set up;Sitting/lateral leans Lower  Body Dressing Details (indicate cue type and reason): cues to incorporate use of L hand Toilet Transfer: Min guard;Ambulation;Regular Teacher, adult education Details (indicate cue type and reason): cues to avoid bumping in to footboard on bed         Functional mobility during ADLs: Min guard       Vision       Perception     Praxis      Cognition Arousal/Alertness: Awake/alert Behavior During Therapy: WFL for tasks assessed/performed Overall Cognitive Status: Impaired/Different from baseline Area of Impairment: Safety/judgement                         Safety/Judgement: Decreased awareness of deficits     General Comments: pt recalled OTs name from previous day, decreased awareness of tendency to bump into obstacles on the L, educated in safety adn compensatory strategies        Exercises     Shoulder Instructions       General Comments      Pertinent Vitals/ Pain       Pain Assessment: No/denies pain  Home Living                                          Prior Functioning/Environment  Frequency  Min 3X/week        Progress Toward Goals  OT Goals(current goals can now be found in the care plan section)  Progress towards OT goals: Progressing toward goals  Acute Rehab OT Goals Patient Stated Goal: return to work OT Goal Formulation: With patient Time For Goal Achievement: 12/29/20 Potential to Achieve Goals: Good  Plan Discharge plan remains appropriate    Co-evaluation                 AM-PAC OT "6 Clicks" Daily Activity     Outcome Measure   Help from another person eating meals?: None Help from another person taking care of personal grooming?: A Little Help from another person toileting, which includes using toliet, bedpan, or urinal?: A Little Help from another person bathing (including washing, rinsing, drying)?: A Little Help from another person to put on and taking off regular upper  body clothing?: A Little Help from another person to put on and taking off regular lower body clothing?: A Little 6 Click Score: 19    End of Session    OT Visit Diagnosis: Unsteadiness on feet (R26.81);Other abnormalities of gait and mobility (R26.89);Muscle weakness (generalized) (M62.81);Hemiplegia and hemiparesis Hemiplegia - Right/Left: Left Hemiplegia - dominant/non-dominant: Dominant Hemiplegia - caused by: Cerebral infarction   Activity Tolerance Patient tolerated treatment well   Patient Left in chair;with call bell/phone within reach   Nurse Communication          Time: 3086-5784 OT Time Calculation (min): 23 min  Charges: OT Treatments $Self Care/Home Management : 23-37 mins  Martie Round, OTR/L Acute Rehabilitation Services Pager: [phone] Office: [phone]   Evern Bio 12/16/2020, 10:02 AM

## 2020-12-17 ENCOUNTER — Other Ambulatory Visit (HOSPITAL_COMMUNITY): Payer: Self-pay

## 2020-12-17 LAB — CBC WITH DIFFERENTIAL/PLATELET
Abs Immature Granulocytes: 0.03 10*3/uL (ref 0.00–0.07)
Basophils Absolute: 0 10*3/uL (ref 0.0–0.1)
Basophils Relative: 1 %
Eosinophils Absolute: 0.1 10*3/uL (ref 0.0–0.5)
Eosinophils Relative: 2 %
HCT: 39.9 % (ref 39.0–52.0)
Hemoglobin: 13.3 g/dL (ref 13.0–17.0)
Immature Granulocytes: 1 %
Lymphocytes Relative: 27 %
Lymphs Abs: 1.6 10*3/uL (ref 0.7–4.0)
MCH: 29.1 pg (ref 26.0–34.0)
MCHC: 33.3 g/dL (ref 30.0–36.0)
MCV: 87.3 fL (ref 80.0–100.0)
Monocytes Absolute: 0.6 10*3/uL (ref 0.1–1.0)
Monocytes Relative: 10 %
Neutro Abs: 3.6 10*3/uL (ref 1.7–7.7)
Neutrophils Relative %: 59 %
Platelets: 161 10*3/uL (ref 150–400)
RBC: 4.57 MIL/uL (ref 4.22–5.81)
RDW: 13.3 % (ref 11.5–15.5)
WBC: 6 10*3/uL (ref 4.0–10.5)
nRBC: 0 % (ref 0.0–0.2)

## 2020-12-17 LAB — BASIC METABOLIC PANEL
Anion gap: 7 (ref 5–15)
BUN: 20 mg/dL (ref 8–23)
CO2: 28 mmol/L (ref 22–32)
Calcium: 9.2 mg/dL (ref 8.9–10.3)
Chloride: 103 mmol/L (ref 98–111)
Creatinine, Ser: 1.77 mg/dL — ABNORMAL HIGH (ref 0.61–1.24)
GFR, Estimated: 43 mL/min — ABNORMAL LOW (ref >60)
Glucose, Bld: 96 mg/dL (ref 70–99)
Potassium: 3.8 mmol/L (ref 3.5–5.1)
Sodium: 138 mmol/L (ref 135–145)

## 2020-12-17 MED ORDER — CLOPIDOGREL BISULFATE 75 MG PO TABS
75.0000 mg | ORAL_TABLET | Freq: Every day | ORAL | 0 refills | Status: AC
  Filled 2020-12-17: qty 21, 21d supply, fill #0

## 2020-12-17 MED ORDER — ATORVASTATIN CALCIUM 40 MG PO TABS
40.0000 mg | ORAL_TABLET | Freq: Every day | ORAL | 1 refills | Status: AC
  Filled 2020-12-17: qty 30, 30d supply, fill #0

## 2020-12-17 MED ORDER — AMLODIPINE BESYLATE 10 MG PO TABS
10.0000 mg | ORAL_TABLET | Freq: Every day | ORAL | 1 refills | Status: AC
  Filled 2020-12-17: qty 30, 30d supply, fill #0

## 2020-12-17 MED ORDER — SPIRONOLACTONE 12.5 MG HALF TABLET
12.5000 mg | ORAL_TABLET | Freq: Every day | ORAL | Status: DC
  Administered 2020-12-17: 12.5 mg via ORAL
  Filled 2020-12-17: qty 1

## 2020-12-17 MED ORDER — ASPIRIN 81 MG PO TBEC
81.0000 mg | DELAYED_RELEASE_TABLET | Freq: Every day | ORAL | 11 refills | Status: AC
  Filled 2020-12-17: qty 30, 30d supply, fill #0

## 2020-12-17 MED ORDER — AMLODIPINE BESYLATE 10 MG PO TABS
10.0000 mg | ORAL_TABLET | Freq: Every day | ORAL | Status: DC
  Administered 2020-12-17: 10 mg via ORAL
  Filled 2020-12-17: qty 1

## 2020-12-17 MED ORDER — SPIRONOLACTONE 50 MG PO TABS
50.0000 mg | ORAL_TABLET | Freq: Every day | ORAL | 1 refills | Status: AC
  Filled 2020-12-17: qty 30, 30d supply, fill #0

## 2020-12-17 NOTE — Progress Notes (Addendum)
Physical Therapy Treatment Patient Details Name: Gary Mayer MRN: [id number] DOB: Jan 06, 1956 Today's Date: 12/17/2020    History of Present Illness Pt is a 65 year old man admitted with L side weakness and slurred speech on 12/14/20. 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 PMH: HTN with poor med compliance, CHF, CKD 3.    PT Comments    Pt received in recliner, eager to participate in therapy. He required supervision transfers and supervision/min guard ambulation 1000' without AD. L foot drag noted with fatigue. L inattention with cues required to manage obstacles on L. Decreased awareness of deficits but this is improving. Pt returned to recliner at end of session. D/C rec updated to OPPT as pt now plans to stay in [place] and will have transportation.     Follow Up Recommendations  Outpatient PT;Supervision for mobility/OOB     Equipment Recommendations  None recommended by PT    Recommendations for Other Services       Precautions / Restrictions Precautions Precautions: Fall;Other (comment) Precaution Comments: L inattention    Mobility  Bed Mobility               General bed mobility comments: Pt in recliner.    Transfers Overall transfer level: Needs assistance Equipment used: None Transfers: Sit to/from [employer] Sit to Stand: Supervision Stand pivot transfers: Supervision       General transfer comment: supervision for safety  Ambulation/Gait Ambulation/Gait assistance: Min guard;Supervision Gait Distance (Feet): 1000 Feet Assistive device: None Gait Pattern/deviations: Step-through pattern;Decreased stride length;Drifts right/left Gait velocity: decreased Gait velocity interpretation: >2.62 ft/sec, indicative of community ambulatory General Gait Details: foot drag noted on L with fatigue. Pt aware without cues and able to self correct.Cues to attend to obstacles on  L.   Stairs             Wheelchair Mobility    Modified Rankin (Stroke Patients Only) Modified Rankin (Stroke Patients Only) Pre-Morbid Rankin Score: No symptoms Modified Rankin: Moderate disability     Balance Overall balance assessment: Needs assistance Sitting-balance support: No upper extremity supported;Feet supported Sitting balance-Leahy Scale: Good     Standing balance support: During functional activity;No upper extremity supported Standing balance-Leahy Scale: Fair                              Cognition Arousal/Alertness: Awake/alert Behavior During Therapy: WFL for tasks assessed/performed Overall Cognitive Status: Impaired/Different from baseline Area of Impairment: Safety/judgement;Awareness;Following commands                       Following Commands: Follows one step commands consistently;Follows one step commands with increased time;Follows multi-step commands with increased time Safety/Judgement: Decreased awareness of safety;Decreased awareness of deficits Awareness: Emergent          Exercises      General Comments General comments (skin integrity, edema, etc.): L facial droop, L inattention      Pertinent Vitals/Pain Pain Assessment: No/denies pain    Home Living                      Prior Function            PT Goals (current goals can now be found in the care plan section) Acute Rehab PT Goals Patient Stated Goal: return to work Progress towards PT goals: Progressing toward goals  Frequency    Min 4X/week      PT Plan Discharge plan needs to be updated    Co-evaluation              AM-PAC PT "6 Clicks" Mobility   Outcome Measure  Help needed turning from your back to your side while in a flat bed without using bedrails?: None Help needed moving from lying on your back to sitting on the side of a flat bed without using bedrails?: None Help needed moving to and from a bed to a  chair (including a wheelchair)?: A Little Help needed standing up from a chair using your arms (e.g., wheelchair or bedside chair)?: None Help needed to walk in hospital room?: A Little Help needed climbing 3-5 steps with a railing? : A Little 6 Click Score: 21    End of Session Equipment Utilized During Treatment: Gait belt Activity Tolerance: Patient tolerated treatment well Patient left: in chair;with call bell/phone within reach;with chair alarm set   PT Visit Diagnosis: Other abnormalities of gait and mobility (R26.89);Muscle weakness (generalized) (M62.81);Hemiplegia and hemiparesis Hemiplegia - Right/Left: Left Hemiplegia - dominant/non-dominant: Dominant Hemiplegia - caused by: Cerebral infarction     Time: 0800-0820 PT Time Calculation (min) (ACUTE ONLY): 20 min  Charges:  $Gait Training: 8-22 mins                     Aida Raider, PT  Office # [phone] Pager [phone]    Ilda Foil 12/17/2020, 11:45 AM

## 2020-12-17 NOTE — Progress Notes (Signed)
Occupational Therapy Treatment Patient Details Name: Gary Mayer MRN: [id number] DOB: Nov 08, 1955 Today's Date: 12/17/2020    History of present illness Pt is a 65 year old man admitted with L side weakness and slurred speech on 12/14/20. 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 PMH: HTN with poor med compliance, CHF, CKD 3.   OT comments  Pt using L UE as lead during eating, grooming activities for at least 50% of time. Pt continues to have decreased awareness of deficits, but is receptive to input and agrees he is safest staying with his sister while he recovers. Pt with improved ability to negotiate around objects in room. Agreeable to using tub seat as he fatigues in standing, needing to sit to complete oral care this visit.   Follow Up Recommendations  Home health OT (progressing to OPOT)    Equipment Recommendations  Tub/shower seat    Recommendations for Other Services      Precautions / Restrictions Precautions Precautions: Fall Precaution Comments: L inattention       Mobility Bed Mobility               General bed mobility comments: Pt in recliner.    Transfers Overall transfer level: Needs assistance Equipment used: None Transfers: Sit to/from [employer] Sit to Stand: Supervision Stand pivot transfers: Supervision       General transfer comment: supervision for safety    Balance Overall balance assessment: Needs assistance Sitting-balance support: No upper extremity supported;Feet supported Sitting balance-Leahy Scale: Good     Standing balance support: During functional activity;No upper extremity supported Standing balance-Leahy Scale: Fair                             ADL either performed or assessed with clinical judgement   ADL Overall ADL's : Needs assistance/impaired Eating/Feeding: Set up;Sitting Eating/Feeding Details (indicate cue type and reason): eating  50% of meal with L hand lead Grooming: Oral care;Wash/dry hands;Min guard;Standing;Brushing hair Grooming Details (indicate cue type and reason): pt fatigues and has tendency to lean L requiring seated rest break                 Toilet Transfer: Supervision/safety;Ambulation;Regular Toilet   Toileting- Architect and Hygiene: Supervision/safety;Sit to/from Nurse, children's Details (indicate cue type and reason): pt agreed he needs a tub seat Functional mobility during ADLs: Supervision/safety General ADL Comments: Improved ability to negotiate among objects in walking path.     Vision       Perception     Praxis      Cognition Arousal/Alertness: Awake/alert Behavior During Therapy: WFL for tasks assessed/performed Overall Cognitive Status: Impaired/Different from baseline Area of Impairment: Safety/judgement;Awareness;Following commands                       Following Commands: Follows multi-step commands with increased time Safety/Judgement: Decreased awareness of safety;Decreased awareness of deficits Awareness: Emergent   General Comments: talked with pt about remaining with sister while he recovers and receiving OP therapy if he has transportatio        Exercises     Shoulder Instructions       General Comments L facial droop, L inattention    Pertinent Vitals/ Pain       Pain Assessment: No/denies pain  Home Living  Prior Functioning/Environment              Frequency  Min 3X/week        Progress Toward Goals  OT Goals(current goals can now be found in the care plan section)  Progress towards OT goals: Progressing toward goals  Acute Rehab OT Goals Patient Stated Goal: return to work OT Goal Formulation: With patient Time For Goal Achievement: 12/29/20 Potential to Achieve Goals: Good  Plan Discharge plan remains appropriate     Co-evaluation                 AM-PAC OT "6 Clicks" Daily Activity     Outcome Measure   Help from another person eating meals?: None Help from another person taking care of personal grooming?: A Little Help from another person toileting, which includes using toliet, bedpan, or urinal?: A Little Help from another person bathing (including washing, rinsing, drying)?: A Little Help from another person to put on and taking off regular upper body clothing?: A Little Help from another person to put on and taking off regular lower body clothing?: A Little 6 Click Score: 19    End of Session    OT Visit Diagnosis: Unsteadiness on feet (R26.81);Other abnormalities of gait and mobility (R26.89);Muscle weakness (generalized) (M62.81);Hemiplegia and hemiparesis Hemiplegia - Right/Left: Left Hemiplegia - dominant/non-dominant: Dominant Hemiplegia - caused by: Cerebral infarction   Activity Tolerance Patient tolerated treatment well   Patient Left in chair;with call bell/phone within reach   Nurse Communication          Time: 1000-1028 OT Time Calculation (min): 28 min  Charges: OT General Charges $OT Visit: 1 Visit OT Treatments $Self Care/Home Management : 23-37 mins  Gary Mayer, OTR/L Acute Rehabilitation Services Pager: [phone] Office: [phone]   Evern Bio 12/17/2020, 11:49 AM

## 2020-12-17 NOTE — Discharge Summary (Signed)
Physician Discharge Summary  Gary Mayer NWG:[id number] DOB: 1956-02-22 DOA: 12/14/2020  PCP: Pcp, No  Admit date: 12/14/2020 Discharge date: 12/17/2020  Admitted From: sister's home - here visiting Disposition:  home  Recommendations for Outpatient Follow-up:  1. Follow up with PCP in 1-2 weeks 2. Please obtain BMP in one week 3.   Out patient PT  Home Health: No Equipment/Devices:None   Discharge Condition: Stable CODE STATUS: Full code Diet recommendation: heart health, low sodium  Brief/Interim Summary: Gary Mayer a 65 y.o.malewith PMH significant for uncontrolled hypertension, noncompliance to meds, diastolic CHF, CKD 3B Patient presented to the ED on 5/3 with complaint of left-sided weakness.  5/3, patient woke up with difficulty using the left side of his body, slurred speech, facial asymmetry. In neck several hours, patient also started to become less responsive and was not walking normally  In the ED, patient was extremely hypertensive to 248. MRI brain showed a small acute infarction in the right caudate tail/basal ganglia, no bleeding. Admitted to hospital service for stroke pathway Neurology consult was obtained.  Discharge Diagnoses:  Principal Problem:   Acute ischemic stroke (HCC) Active Problems:   HTN (hypertension)   CKD (chronic kidney disease) stage 3, GFR 30-59 ml/min (HCC)  Acute right-sided stroke -Presented with several hours of left-sided weakness, slurred speech, facial asymmetry and gait impairment -MRI finding as above showing small acute infarcts in the right carotid tail/basal ganglia. Likely secondary to uncontrolled hypertension. -Stroke pathway initiated. -Normal echo, normal carotid duplex -A1c 5.7, LDL 131, HDL 56 -Neurology consult appreciated. Recommended aspirin 81 mg daily and Plavix 75 daily for 3 weeks followed by aspirin only. Also started on Lipitor 40 mg daily. -PT/OT/ST completed evaluation  Hypertensive  emergency -History of uncontrolled hypertension, noncompliance to meds -Currently permissive hypertension allowed for systolic blood pressure up to 63. -BP meds titrated up to improve BP control  Chronic diastolic CHF -New echo shows no Diastolic dysfunction EF is 60-65%, mild LVH, mild AS  CKD 3b -Creatinine was 1.87 in June 2019 per care everywhere -Present with a creatinine 1.77. Continue to monitor.  Discharge Instructions:  Discharge Instructions    Ambulatory referral to Physical Therapy   Complete by: As directed    Dr Delfino Lovett [phone] will follow please direct all questions to his office thanks   Iontophoresis - 4 mg/ml of dexamethasone: No   T.E.N.S. Unit Evaluation and Dispense as Indicated: No   Ambulatory referral to Physical Therapy   Complete by: As directed    Iontophoresis - 4 mg/ml of dexamethasone: No   T.E.N.S. Unit Evaluation and Dispense as Indicated: No   Call MD for:  difficulty breathing, headache or visual disturbances   Complete by: As directed    Call MD for:  extreme fatigue   Complete by: As directed    Call MD for:  persistant nausea and vomiting   Complete by: As directed    Call MD for:  severe uncontrolled pain   Complete by: As directed    Diet - low sodium heart healthy   Complete by: As directed    Increase activity slowly   Complete by: As directed      Allergies as of 12/17/2020   No Known Allergies     Medication List    TAKE these medications   amLODipine 10 MG tablet Commonly known as: NORVASC Take 10 mg by mouth daily.   aspirin 81 MG EC tablet Take 1 tablet (81 mg total) by mouth  daily. Swallow whole. Start taking on: Dec 18, 2020   atorvastatin 40 MG tablet Commonly known as: LIPITOR Take 1 tablet (40 mg total) by mouth daily. Start taking on: Dec 18, 2020   clopidogrel 75 MG tablet Commonly known as: PLAVIX Take 1 tablet (75 mg total) by mouth daily. Start taking on: Dec 18, 2020   lidocaine 5  % Commonly known as: LIDODERM Place 1 patch onto the skin daily as needed (pain).   LUTEIN PO Take 1 capsule by mouth daily.   One-A-Day Mens 50+ Tabs Take 1 tablet by mouth daily.   OVER THE COUNTER MEDICATION Take 2 tablets by mouth daily. Kyolic cardiovascular vitamin garlic based   spironolactone 50 MG tablet Commonly known as: ALDACTONE Take 50 mg by mouth daily.            Durable Medical Equipment  (From admission, onward)         Start     Ordered   12/16/20 1125  For home use only DME Tub bench  Once        12/16/20 1125   12/16/20 1124  For home use only DME Walker rolling  Once       Question Answer Comment  Walker: With 5 Inch Wheels   Patient needs a walker to treat with the following condition Weakness      12/16/20 1125          Follow-up Information    Local neurology in [place]. Schedule an appointment as soon as possible for a visit in 4 week(s).        [facility] Follow up.   Why: January 13, 2021 at 1:40 pm        [facility] Follow [facility].   Why: [phone]       [facility] Follow [facility].   Specialty: Rehabilitation Contact information: [address] [id number] mc [place] [address] [phone]             No Known Allergies  Consultations:  Neurology   Procedures/Studies: CT Head Wo Contrast  Result Date: 12/14/2020 CLINICAL DATA:  Weakness hypertension EXAM: CT HEAD WITHOUT CONTRAST TECHNIQUE: Contiguous axial images were obtained from the base of the skull through the vertex without intravenous contrast. COMPARISON:  None. FINDINGS: Brain: No acute territorial infarction, hemorrhage or intracranial mass. Advanced hypodensity in the white matter consistent with chronic small vessel ischemic change. Age indeterminate lacunar infarcts within the left basal ganglia/white matter, right thalamus and pons. Nonenlarged ventricles Vascular: No hyperdense  vessels.  No unexpected calcification. Skull: Normal. Negative for fracture or focal lesion. Sinuses/Orbits: No acute finding. Other: None IMPRESSION: 1. Advanced chronic small vessel ischemic change of white matter. Age indeterminate lacunar infarcts within the left basal ganglia, left white matter, right thalamus and pons. 2. Negative for hemorrhage or intracranial mass Electronically Signed   By: Jasmine Pang M.D.   On: 12/14/2020 19:56   MR ANGIO HEAD WO CONTRAST  Result Date: 12/14/2020 CLINICAL DATA:  Stroke-like symptoms EXAM: MRA HEAD WITHOUT CONTRAST TECHNIQUE: Angiographic images of the Circle of Willis were obtained using MRA technique without intravenous contrast. COMPARISON:  None. FINDINGS: POSTERIOR CIRCULATION: --Basilar artery: Normal. --Superior cerebellar arteries: Normal. --Posterior cerebral arteries: Normal. ANTERIOR CIRCULATION: --Intracranial internal carotid arteries: Normal. --Anterior cerebral arteries (ACA): Normal. Hypoplastic right A1 segment, normal variant. --Middle cerebral arteries (MCA): Normal. IMPRESSION: Normal intracranial MRA. Electronically Signed   By: Chrisandra Netters.D.  On: 12/14/2020 22:23   MR Brain Wo Contrast (neuro protocol)  Result Date: 12/14/2020 CLINICAL DATA:  Stroke-like symptoms EXAM: MRI HEAD WITHOUT CONTRAST TECHNIQUE: Multiplanar, multiecho pulse sequences of the brain and surrounding structures were obtained without intravenous contrast. COMPARISON:  None. FINDINGS: Brain: Small acute infarct of the right caudate tail. No acute or chronic hemorrhage. Confluent hyperintense T2-weighted white matter signal. Generalized volume loss without a clear lobar predilection. There are multiple old small vessel infarcts of the brainstem and deep gray nuclei. The midline structures are normal. Vascular: Major flow voids are preserved. Skull and upper cervical spine: Normal calvarium and skull base. Visualized upper cervical spine and soft tissues are normal.  Sinuses/Orbits:No paranasal sinus fluid levels or advanced mucosal thickening. No mastoid or middle ear effusion. Normal orbits. IMPRESSION: 1. Small acute infarct of the right caudate tail. No hemorrhage or mass effect. 2. Multiple old small vessel infarcts of the brainstem and deep gray nuclei. Electronically Signed   By: Deatra Robinson M.D.   On: 12/14/2020 22:10   DG Chest Portable 1 View  Result Date: 12/14/2020 CLINICAL DATA:  Altered mental status EXAM: PORTABLE CHEST 1 VIEW COMPARISON:  None. FINDINGS: The heart size and mediastinal contours are within normal limits. Both lungs are clear. The visualized skeletal structures are unremarkable. IMPRESSION: No active disease. Electronically Signed   By: Jasmine Pang M.D.   On: 12/14/2020 19:56   ECHOCARDIOGRAM COMPLETE  Result Date: 12/15/2020    ECHOCARDIOGRAM REPORT   Patient Name:   Gary Mayer Date of Exam: 12/15/2020 Medical Rec #:  [id number]     Height: Accession #:    [id number]    Weight: Date of Birth:  Apr 18, 1956    BSA: Patient Age:    64 years      BP:           208/123 mmHg Patient Gender: M             HR:           80 bpm. Exam Location:  Inpatient Procedure: 2D Echo, Cardiac Doppler and Color Doppler Indications:    CVA  History:        Patient has no prior history of Echocardiogram examinations.                 CHF, Stroke; Risk Factors:Hypertension and CKD.  Sonographer:    Lavenia Atlas Referring Phys: [phone] JARED M GARDNER IMPRESSIONS  1. Left ventricular ejection fraction, by estimation, is 60 to 65%. The left ventricle has normal function. The left ventricle has no regional wall motion abnormalities. There is mild left ventricular hypertrophy. Left ventricular diastolic parameters were normal.  2. Right ventricular systolic function is normal. The right ventricular size is normal.  3. Left atrial size was mildly dilated.  4. The mitral valve is normal in structure. No evidence of mitral valve regurgitation. No evidence of mitral  stenosis.  5. The aortic valve is tricuspid. There is mild calcification of the aortic valve. Aortic valve regurgitation is not visualized. Mild aortic valve sclerosis is present, with no evidence of aortic valve stenosis.  6. The inferior vena cava is normal in size with greater than 50% respiratory variability, suggesting right atrial pressure of 3 mmHg. FINDINGS  Left Ventricle: Left ventricular ejection fraction, by estimation, is 60 to 65%. The left ventricle has normal function. The left ventricle has no regional wall motion abnormalities. The left ventricular internal cavity size was normal in size. There is  mild  left ventricular hypertrophy. Left ventricular diastolic parameters were normal. Right Ventricle: The right ventricular size is normal. No increase in right ventricular wall thickness. Right ventricular systolic function is normal. Left Atrium: Left atrial size was mildly dilated. Right Atrium: Right atrial size was normal in size. Pericardium: There is no evidence of pericardial effusion. Mitral Valve: The mitral valve is normal in structure. No evidence of mitral valve regurgitation. No evidence of mitral valve stenosis. Tricuspid Valve: The tricuspid valve is normal in structure. Tricuspid valve regurgitation is trivial. No evidence of tricuspid stenosis. Aortic Valve: The aortic valve is tricuspid. There is mild calcification of the aortic valve. Aortic valve regurgitation is not visualized. Mild aortic valve sclerosis is present, with no evidence of aortic valve stenosis. Pulmonic Valve: The pulmonic valve was normal in structure. Pulmonic valve regurgitation is not visualized. No evidence of pulmonic stenosis. Aorta: The aortic root is normal in size and structure. Venous: The inferior vena cava is normal in size with greater than 50% respiratory variability, suggesting right atrial pressure of 3 mmHg. IAS/Shunts: No atrial level shunt detected by color flow Doppler.  LEFT VENTRICLE PLAX 2D  LVIDd:         4.70 cm  Diastology LVIDs:         2.90 cm  LV e' medial:    5.11 cm/s LV PW:         1.40 cm  LV E/e' medial:  12.7 LV IVS:        1.20 cm  LV e' lateral:   7.18 cm/s LVOT diam:     2.30 cm  LV E/e' lateral: 9.1 LV SV:         59 LVOT Area:     4.15 cm  RIGHT VENTRICLE RV Basal diam:  2.60 cm RV S prime:     5.87 cm/s TAPSE (M-mode): 2.8 cm LEFT ATRIUM             RIGHT ATRIUM LA diam:        4.10 cm RA Area:     11.40 cm LA Vol (A2C):   35.2 ml RA Volume:   24.70 ml LA Vol (A4C):   42.9 ml LA Biplane Vol: 39.3 ml  AORTIC VALVE LVOT Vmax:   79.80 cm/s LVOT Vmean:  46.200 cm/s LVOT VTI:    0.142 m  AORTA Ao Root diam: 3.20 cm MITRAL VALVE MV Area (PHT): 4.39 cm    SHUNTS MV Decel Time: 173 msec    Systemic VTI:  0.14 m MV E velocity: 65.10 cm/s  Systemic Diam: 2.30 cm MV A velocity: 63.80 cm/s MV E/A ratio:  1.02 Charlton Haws MD Electronically signed by Charlton Haws MD Signature Date/Time: 12/15/2020/10:58:32 AM    Final    VAS US CAROTID (at [facility] and [facility] only)  Result Date: 12/16/2020 Carotid Arterial Duplex Study Patient Name:  Gary Mayer  Date of Exam:   12/15/2020 Medical Rec #: [id number]      Accession #:    [id number] Date of Birth: 03-Nov-1955     Patient Gender: M Patient Age:   64Y Exam Location:  [facility] Procedure:      VAS US CAROTID Referring Phys: [id number] [address] Iles GARDNER --------------------------------------------------------------------------------  Indications:       CVA. Risk Factors:      Hypertension. Comparison Study:  no prior Performing Technologist: Blanch Media RVS  Examination Guidelines: A complete evaluation includes B-mode imaging, spectral Doppler, color Doppler, and power Doppler as needed of  all accessible portions of each vessel. Bilateral testing is considered an integral part of a complete examination. Limited examinations for reoccurring indications may be performed as noted.  Right Carotid Findings:  +----------+--------+--------+--------+------------------+--------+           PSV cm/sEDV cm/sStenosisPlaque DescriptionComments +----------+--------+--------+--------+------------------+--------+ CCA Prox  109     22              heterogenous               +----------+--------+--------+--------+------------------+--------+ CCA Distal104     14              heterogenous               +----------+--------+--------+--------+------------------+--------+ ICA Prox  49      10      1-39%   heterogenous               +----------+--------+--------+--------+------------------+--------+ ICA Distal37      9                                          +----------+--------+--------+--------+------------------+--------+ ECA       150     22                                         +----------+--------+--------+--------+------------------+--------+ +----------+--------+-------+--------+-------------------+           PSV cm/sEDV cmsDescribeArm Pressure (mmHG) +----------+--------+-------+--------+-------------------+ [id number]                                         +----------+--------+-------+--------+-------------------+ +---------+--------+--+--------+--+---------+ VertebralPSV cm/s40EDV cm/s10Antegrade +---------+--------+--+--------+--+---------+  Left Carotid Findings: +----------+--------+--------+--------+------------------+--------+           PSV cm/sEDV cm/sStenosisPlaque DescriptionComments +----------+--------+--------+--------+------------------+--------+ CCA Prox  98      20              heterogenous               +----------+--------+--------+--------+------------------+--------+ CCA Distal101     36              heterogenous               +----------+--------+--------+--------+------------------+--------+ ICA Prox  78      28      1-39%   heterogenous                +----------+--------+--------+--------+------------------+--------+ ICA Distal83      20                                         +----------+--------+--------+--------+------------------+--------+ ECA       161     22                                         +----------+--------+--------+--------+------------------+--------+ +----------+--------+--------+--------+-------------------+           PSV cm/sEDV cm/sDescribeArm Pressure (mmHG) +----------+--------+--------+--------+-------------------+ [id number]                                          +----------+--------+--------+--------+-------------------+ +---------+--------+--+--------+--+---------+  VertebralPSV cm/s27EDV cm/s13Antegrade +---------+--------+--+--------+--+---------+   Summary: Right Carotid: Velocities in the right ICA are consistent with a 1-39% stenosis. Left Carotid: Velocities in the left ICA are consistent with a 1-39% stenosis. Vertebrals: Bilateral vertebral arteries demonstrate antegrade flow. *See table(s) above for measurements and observations.  Electronically signed by Delia HeadyPramod Sethi MD on 12/16/2020 at 8:15:12 AM.    Final        Subjective: Feels well today--ambulating without difficulty  Discharge Exam: Vitals:   12/17/20 0542 12/17/20 0700  BP: (!) 174/95 (!) 180/109  Pulse: 63 72  Resp: 18 16  Temp: 98.5 F (36.9 C) 98.1 F (36.7 C)  SpO2: 99% 98%   Vitals:   12/16/20 2048 12/17/20 0054 12/17/20 0542 12/17/20 0700  BP: (!) 173/98 (!) 170/88 (!) 174/95 (!) 180/109  Pulse: 64 82 63 72  Resp: 18 18 18 16   Temp: 98.6 F (37 C) 98.7 F (37.1 C) 98.5 F (36.9 C) 98.1 F (36.7 C)  TempSrc: Oral Oral Oral Oral  SpO2: 98% 95% 99% 98%    General: Pt is alert, awake, not in acute distress Cardiovascular: RRR, S1/S2 +, no rubs, no gallops Respiratory: CTA bilaterally, no wheezing, no rhonchi Abdominal: Soft, NT, ND, bowel sounds + Extremities: no edema, no cyanosis Neuro: LUE  weakness, 4/5    The results of significant diagnostics from this hospitalization (including imaging, microbiology, ancillary and laboratory) are listed below for reference.     Microbiology: Recent Results (from the past 240 hour(s))  SARS CORONAVIRUS 2 (TAT 6-24 HRS) Nasopharyngeal Nasopharyngeal Swab     Status: None   Collection Time: 12/14/20  8:44 PM   Specimen: Nasopharyngeal Swab  Result Value Ref Range Status   SARS Coronavirus 2 NEGATIVE NEGATIVE Final    Comment: (NOTE) SARS-CoV-2 target nucleic acids are NOT DETECTED.  The SARS-CoV-2 RNA is generally detectable in upper and lower respiratory specimens during the acute phase of infection. Negative results do not preclude SARS-CoV-2 infection, do not rule out co-infections with other pathogens, and should not be used as the sole basis for treatment or other patient management decisions. Negative results must be combined with clinical observations, patient history, and epidemiological information. The expected result is Negative.  Fact Sheet for Patients: HairSlick.nohttps://www.fda.gov/media/138098/download  Fact Sheet for Healthcare Providers: quierodirigir.comhttps://www.fda.gov/media/138095/download  This test is not yet approved or cleared by the [place] FDA and  has been authorized for detection and/or diagnosis of SARS-CoV-2 by FDA under an Emergency Use Authorization (EUA). This EUA will remain  in effect (meaning this test can be used) for the duration of the COVID-19 declaration under Se ction 564(b)(1) of the Act, 21 U.S.C. section 360bbb-3(b)(1), unless the authorization is terminated or revoked sooner.  Performed at [facility], [address] [place], [place] [address]      Labs: Basic Metabolic Panel: Recent Labs  Lab 12/14/20 1818 12/16/20 0242 12/17/20 0310  NA 136 138 138  K 3.5 3.9 3.8  CL 104 104 103  CO2 24 28 28   GLUCOSE 112* 103* 96  BUN 17 19 20   CREATININE 1.77* 1.77* 1.77*  CALCIUM 9.2  9.2 9.2   Liver Function Tests: Recent Labs  Lab 12/14/20 1818  AST 32  ALT 25  ALKPHOS 124  BILITOT 0.9  PROT 7.4  ALBUMIN 4.1   CBC: Recent Labs  Lab 12/14/20 1818 12/16/20 0242 12/17/20 0310  WBC 6.1 5.6 6.0  NEUTROABS 4.2 3.3 3.6  HGB 14.8 13.5 13.3  HCT 44.5 40.1  39.9  MCV 87.1 87.6 87.3  PLT 192 160 161   Hgb A1c Recent Labs    12/15/20 0403  HGBA1C 5.7*   Lipid Profile Recent Labs    12/15/20 0403  CHOL 193  HDL 56  LDLCALC 131*  TRIG 32  CHOLHDL 3.4   Urinalysis    Component Value Date/Time   COLORURINE YELLOW 12/14/2020 1921   APPEARANCEUR CLEAR 12/14/2020 1921   LABSPEC 1.011 12/14/2020 1921   PHURINE 7.0 12/14/2020 1921   GLUCOSEU NEGATIVE 12/14/2020 1921   HGBUR MODERATE (A) 12/14/2020 1921   BILIRUBINUR NEGATIVE 12/14/2020 1921   KETONESUR NEGATIVE 12/14/2020 1921   PROTEINUR 30 (A) 12/14/2020 1921   NITRITE NEGATIVE 12/14/2020 1921   LEUKOCYTESUR NEGATIVE 12/14/2020 1921   Sepsis Labs Microbiology Recent Results (from the past 240 hour(s))  SARS CORONAVIRUS 2 (TAT 6-24 HRS) Nasopharyngeal Nasopharyngeal Swab     Status: None   Collection Time: 12/14/20  8:44 PM   Specimen: Nasopharyngeal Swab  Result Value Ref Range Status   SARS Coronavirus 2 NEGATIVE NEGATIVE Final    Comment: (NOTE) SARS-CoV-2 target nucleic acids are NOT DETECTED.  The SARS-CoV-2 RNA is generally detectable in upper and lower respiratory specimens during the acute phase of infection. Negative results do not preclude SARS-CoV-2 infection, do not rule out co-infections with other pathogens, and should not be used as the sole basis for treatment or other patient management decisions. Negative results must be combined with clinical observations, patient history, and epidemiological information. The expected result is Negative.  Fact Sheet for Patients: HairSlick.no  Fact Sheet for Healthcare  Providers: quierodirigir.com  This test is not yet approved or cleared by the [place] FDA and  has been authorized for detection and/or diagnosis of SARS-CoV-2 by FDA under an Emergency Use Authorization (EUA). This EUA will remain  in effect (meaning this test can be used) for the duration of the COVID-19 declaration under Se ction 564(b)(1) of the Act, 21 U.S.C. section 360bbb-3(b)(1), unless the authorization is terminated or revoked sooner.  Performed at [facility], [address], [place], [place] [address]      Time coordinating discharge: Over 30 minutes  SIGNED:   Reva Bores, MD  [facility] 12/17/2020, 12:30 PM  If 7PM-7AM, please contact night-coverage

## 2020-12-17 NOTE — Progress Notes (Signed)
RN gave pt discharge instructions and the patient stated understanding. IV has been removed, pt and his sister stated he does not have any spironolactone  Or amlodipine MD notified and will send script to [facility]

## 2020-12-17 NOTE — Discharge Instructions (Signed)
Physical Therapy After a Stroke After a stroke, some people experience physical changes or problems. Physical therapy may be prescribed to help you recover and overcome problems such as:  Inability to move (paralysis) or weakness, typically affecting one side of the body.  Trouble with balance.  Pain, a pins and needles sensation, or numbness in certain parts of the body. You may also have difficulty feeling touch, pressure, or changes in temperature.  Involuntary muscle tightening (spasticity).  Stiffness in muscles and joints.  Altered coordination and reflexes. What causes physical disability after a stroke? A stroke can damage parts of your brain that control your body's normal functions, including your ability to move and to keep your balance. The types of physical problems you have will depend on how severe the stroke was and where it was located in the brain. Weakness or paralysis may affect just your fingers and hands, a whole leg or arm, or an entire side of your body. What is physical therapy? Physical therapy involves using exercises, stretches, and activities to help you regain movement and independence after your stroke. Physical therapy may focus on one or more of the following:  Range of motion. This can help with movement and reduce muscle stiffness.  Balance. This helps to lower your risk of falling.  Position changes or transfers, such as moving from sitting to standing or from a chair to a bed.  Coordination, such as getting an object from a shelf.  Muscle strength. Muscles may be strengthened with weights or by repeating certain motions.  Functional mobility. This may include stair training or learning how to use a wheelchair, walker, or cane.  Walking (gait training).  Activities of daily living, such as getting out of the car or buttoning a shirt. Why is physical therapy important? It is important to do exercises and follow your rehabilitation plan as told by  your physical therapist. Physical therapy can:  Help you regain independence.  Prevent injury from falls by building strength and balance.  Lower your risk of blood clots.  Lower your risk of skin sores (pressure injuries).  Increase physical activity and exercise. This may help lower your risk for another stroke.  Help reduce pain. When will therapy start and where will I have therapy? Your health care provider will decide when it is best for you to start therapy. In some cases, people start rehabilitation, including physical therapy, as soon as they are medically stable, which may be 24-48 hours after a stroke. Rehabilitation can take place in a few different places, based on your needs. It may take place in:  The hospital or an in-patient rehabilitation hospital.  An outpatient rehabilitation facility.  A long-term care facility.  A community rehabilitation clinic.  Your home. What are assistive devices? Assistive devices are tools to help you move, maintain balance, and manage daily tasks while recovering from a stroke. Your physical therapist may recommend and help you learn to use:  Equipment to help you move, such as wheelchairs, canes, or walkers.  Braces or splints to keep your arms, hands, legs, or feet in a comfortable and safe position.  Bathtub benches or grab bars to keep you safe in the bathroom.  Special utensils, bowls, and plates that allow you to eat with one hand. It is important to use these devices as told by your health care provider.   Summary  After a stroke, some people may experience physical disabilities, such as weakness or paralysis, pain, or balance problems.  Physical therapy involves exercises, stretches, and activities that help to improve your ability to move and to handle daily tasks.  Physical therapy exercises focus on restoring range of motion, balance, coordination, muscle strength, and the ability to move (mobility).  Physical  therapy can help you regain independence, prevent falls, and allow you to live a more active lifestyle after a stroke. This information is not intended to replace advice given to you by your health care provider. Make sure you discuss any questions you have with your health care provider. Document Revised: 11/21/2018 Document Reviewed: 11/06/2016 Elsevier Patient Education  2021 [employer].   Driving After a Stroke Driving can be dangerous after a stroke because a stroke can cause physical, emotional, cognitive, and behavioral changes. Damage to your brain and other parts of your nervous system may affect your ability to drive. You may have weakness, stiffness, and pain, and have problems moving, talking, seeing, touching, or problem-solving. A stroke can also cause inability to move (paralysis) on one side of your body. Can I return to driving? Ask your health care provider when it is safe for you to drive. Laws on driving after a stroke vary by state. Your health care provider may recommend that you:  Get a driving evaluation to have your vision, thinking, reaction time, and driving skills tested.  Take a driving rehabilitation program for people who have had a stroke.  Take a driving class or a retraining program.   How is driving affected by a stroke? A family member may be the first to notice that it is not safe for you to drive. You may have problems with:  Your vision.  Talking and communicating.  Weakness, pain, and stiffness in your arms or legs.  Responding to changes on the road.  Using the steering wheel, pedals, and other parts of the car.  Thinking while driving.  Judgment on the road. What are some signs that it may not be safe for me to drive? Signs that driving may be unsafe for you include:  Driving too fast or too slowly.  Needing help from others while driving.  Not paying attention to street signs or signals.  Making bad decisions while driving.  Not  keeping enough distance between cars.  Drifting into other lanes.  Becoming confused, angry, or frustrated.  Getting lost in familiar places.  Having accidents while driving. What is adaptive equipment? Adaptive equipment refers to devices that can help people who have had a stroke to drive and do other activities. You may need:  A wheelchair-accessible car.  Special hand controls in the car.  Pedal extensions for the car.  A seat base to help you stay positioned in your seat.  Lifts and ramps to help you get in and out of the car. Summary  Damage to your brain and other parts of your nervous system may affect your ability to drive.  Ask your health care provider when it is safe for you to drive again. You may need to take steps such as getting a driving evaluation or taking a driving class.  A family member may be the first to notice that it is not safe for you to drive.  You may need adaptive equipment to drive safely. This information is not intended to replace advice given to you by your health care provider. Make sure you discuss any questions you have with your health care provider. Document Revised: 07/13/2017 Document Reviewed: 11/06/2016 Elsevier Patient Education  2021 [employer].

## 2020-12-17 NOTE — TOC Progression Note (Addendum)
Transition of Care [facility]) - Progression Note    Patient Details  Name: Gary Mayer MRN: [id number] Date of Birth: Sep 19, 1955  [facility] of [facility] CM/SW Contact  Nadene Rubins Adria Devon, RN Phone Number: 12/17/2020, 11:28 AM  Clinical Narrative:    NCM unable to confirm HHPT in [place], awaiting [facility] determination.  Spoke to patient and sister at bedside. Patient wants to stay with his sister here in [place] and have HHPT.   Sister address is [address] [place], [place], [place] [address] .  [facility] can accept , IF a local doctor can sign orders between discahrge and his follow up appointment with Dr Kyung Rudd June 2, 20022. NCM secure chatted Dr Shawnie Pons and Director on Dr Delfino Lovett  Await answer.   Patient and sister decided on OP PT . Dr Sherryll Burger agreed to sign orders   Expected Discharge Plan: Home w Home Health Services    Expected Discharge Plan and Services Expected Discharge Plan: Home w Home Health Services   Discharge Planning Services: CM Consult Post Acute Care Choice: Home Health,Durable Medical Equipment Living arrangements for the past 2 months: Single Family Home                 DME Arranged: Tub bench,Walker rolling DME Agency: [employer] Date DME Agency Contacted: 12/16/20 Time DME Agency Contacted: [phone]               Social Determinants of Health (SDOH) Interventions    Readmission Risk Interventions No flowsheet data found.

## 2020-12-20 ENCOUNTER — Other Ambulatory Visit (HOSPITAL_BASED_OUTPATIENT_CLINIC_OR_DEPARTMENT_OTHER): Payer: Self-pay

## 2020-12-20 ENCOUNTER — Other Ambulatory Visit (HOSPITAL_COMMUNITY): Payer: Self-pay

## 2020-12-21 ENCOUNTER — Telehealth (HOSPITAL_COMMUNITY): Payer: Self-pay

## 2020-12-21 NOTE — Telephone Encounter (Signed)
Spoke to pt about Clopidogrel. Asked if he could take vitamins with clopidogrel. He asked about a MVI.  Advised adherence and to reach out to pharmacy with Qs.

## 2020-12-22 ENCOUNTER — Encounter: Payer: Self-pay | Admitting: Physical Therapy

## 2020-12-22 ENCOUNTER — Other Ambulatory Visit: Payer: Self-pay

## 2020-12-22 ENCOUNTER — Emergency Department (HOSPITAL_COMMUNITY): Payer: Federal, State, Local not specified - PPO

## 2020-12-22 ENCOUNTER — Encounter (HOSPITAL_COMMUNITY): Payer: Self-pay

## 2020-12-22 ENCOUNTER — Observation Stay (HOSPITAL_COMMUNITY)
Admission: EM | Admit: 2020-12-22 | Discharge: 2020-12-23 | Disposition: A | Discharge status: Home / Self Care | Payer: Federal, State, Local not specified - PPO | Attending: Internal Medicine | Admitting: Internal Medicine

## 2020-12-22 ENCOUNTER — Ambulatory Visit: Payer: Federal, State, Local not specified - PPO | Admitting: Physical Therapy

## 2020-12-22 VITALS — BP 190/110 | HR 77

## 2020-12-22 DIAGNOSIS — I639 Cerebral infarction, unspecified: Secondary | ICD-10-CM | POA: Diagnosis present

## 2020-12-22 DIAGNOSIS — Z79899 Other long term (current) drug therapy: Secondary | ICD-10-CM | POA: Insufficient documentation

## 2020-12-22 DIAGNOSIS — N183 Chronic kidney disease, stage 3 unspecified: Secondary | ICD-10-CM | POA: Diagnosis present

## 2020-12-22 DIAGNOSIS — Z8673 Personal history of transient ischemic attack (TIA), and cerebral infarction without residual deficits: Secondary | ICD-10-CM

## 2020-12-22 DIAGNOSIS — I10 Essential (primary) hypertension: Secondary | ICD-10-CM

## 2020-12-22 DIAGNOSIS — R519 Headache, unspecified: Secondary | ICD-10-CM | POA: Diagnosis present

## 2020-12-22 DIAGNOSIS — Z7982 Long term (current) use of aspirin: Secondary | ICD-10-CM | POA: Diagnosis not present

## 2020-12-22 DIAGNOSIS — Z20822 Contact with and (suspected) exposure to covid-19: Secondary | ICD-10-CM | POA: Insufficient documentation

## 2020-12-22 DIAGNOSIS — R2689 Other abnormalities of gait and mobility: Secondary | ICD-10-CM

## 2020-12-22 DIAGNOSIS — R1310 Dysphagia, unspecified: Secondary | ICD-10-CM

## 2020-12-22 DIAGNOSIS — I130 Hypertensive heart and chronic kidney disease with heart failure and stage 1 through stage 4 chronic kidney disease, or unspecified chronic kidney disease: Secondary | ICD-10-CM | POA: Insufficient documentation

## 2020-12-22 DIAGNOSIS — I5030 Unspecified diastolic (congestive) heart failure: Secondary | ICD-10-CM | POA: Diagnosis not present

## 2020-12-22 DIAGNOSIS — I160 Hypertensive urgency: Principal | ICD-10-CM | POA: Diagnosis present

## 2020-12-22 LAB — BASIC METABOLIC PANEL
Anion gap: 6 (ref 5–15)
BUN: 20 mg/dL (ref 8–23)
CO2: 24 mmol/L (ref 22–32)
Calcium: 9.4 mg/dL (ref 8.9–10.3)
Chloride: 105 mmol/L (ref 98–111)
Creatinine, Ser: 1.6 mg/dL — ABNORMAL HIGH (ref 0.61–1.24)
GFR, Estimated: 48 mL/min — ABNORMAL LOW (ref >60)
Glucose, Bld: 99 mg/dL (ref 70–99)
Potassium: 4.2 mmol/L (ref 3.5–5.1)
Sodium: 135 mmol/L (ref 135–145)

## 2020-12-22 LAB — CBC WITH DIFFERENTIAL/PLATELET
Abs Immature Granulocytes: 0.02 10*3/uL (ref 0.00–0.07)
Basophils Absolute: 0 10*3/uL (ref 0.0–0.1)
Basophils Relative: 1 %
Eosinophils Absolute: 0.1 10*3/uL (ref 0.0–0.5)
Eosinophils Relative: 1 %
HCT: 45.2 % (ref 39.0–52.0)
Hemoglobin: 15 g/dL (ref 13.0–17.0)
Immature Granulocytes: 0 %
Lymphocytes Relative: 20 %
Lymphs Abs: 1.5 10*3/uL (ref 0.7–4.0)
MCH: 29.1 pg (ref 26.0–34.0)
MCHC: 33.2 g/dL (ref 30.0–36.0)
MCV: 87.6 fL (ref 80.0–100.0)
Monocytes Absolute: 0.6 10*3/uL (ref 0.1–1.0)
Monocytes Relative: 9 %
Neutro Abs: 4.9 10*3/uL (ref 1.7–7.7)
Neutrophils Relative %: 69 %
Platelets: 203 10*3/uL (ref 150–400)
RBC: 5.16 MIL/uL (ref 4.22–5.81)
RDW: 13.2 % (ref 11.5–15.5)
WBC: 7.1 10*3/uL (ref 4.0–10.5)
nRBC: 0 % (ref 0.0–0.2)

## 2020-12-22 IMAGING — CT CT HEAD W/O CM
4 series · 16 of 47 positions shown, 18 images · non-contrast
Comparison: Head CT and brain MRI [DATE]

CLINICAL DATA: Neuro deficit, acute, stroke suspected Mental status
change, unknown cause

Patient reports hypertension.  Recent stroke.
EXAM:
CT HEAD WITHOUT CONTRAST
TECHNIQUE: Contiguous axial images were obtained from the base of the skull
through the vertex without intravenous contrast.

[Series 3: head without · axial · non-contrast · 0.50mm/px · z∈[+174,+324]mm · 7 of 40 slices shown, 9 images]
[im 5/40  brain]
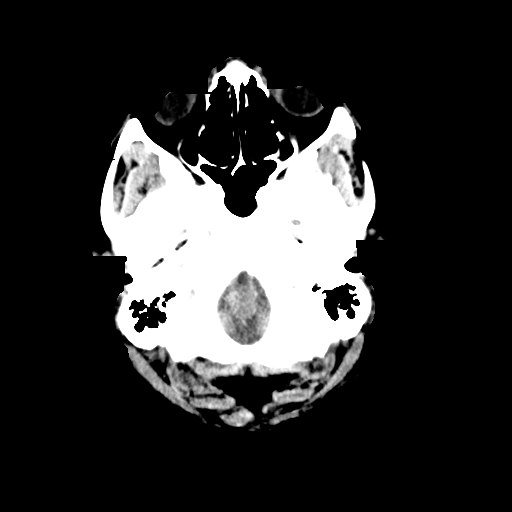
[im 5/40  bone]
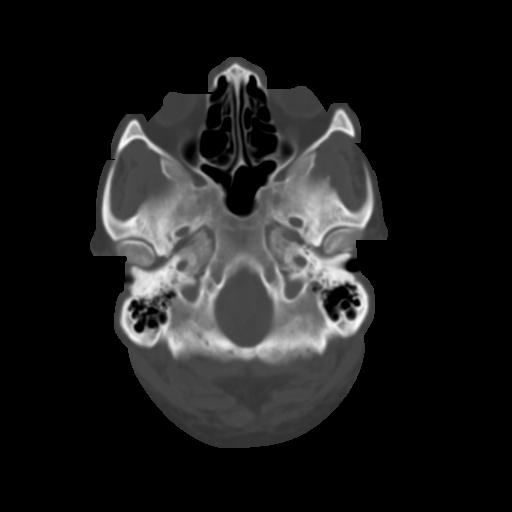
[im 10/40  brain]
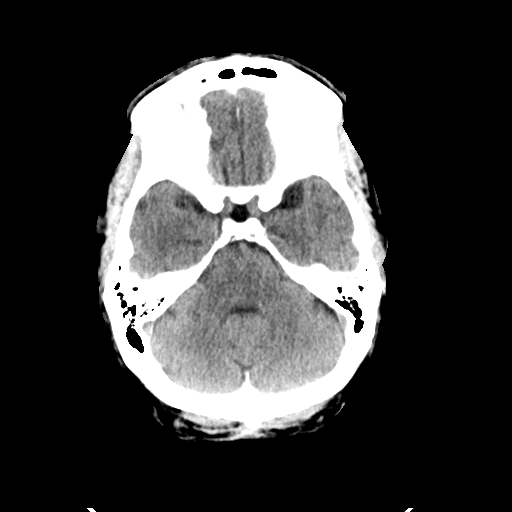
[im 15/40  brain]
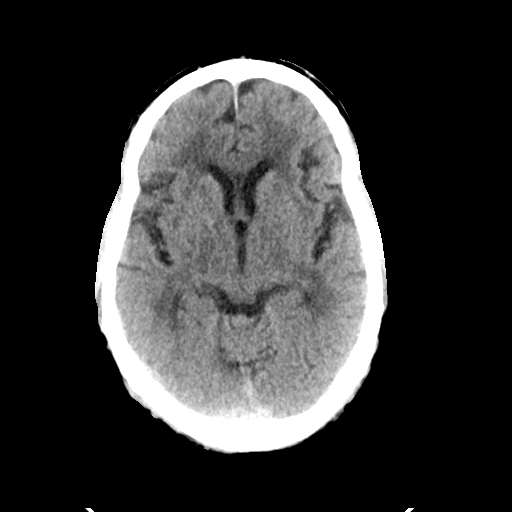
[im 20/40  brain]
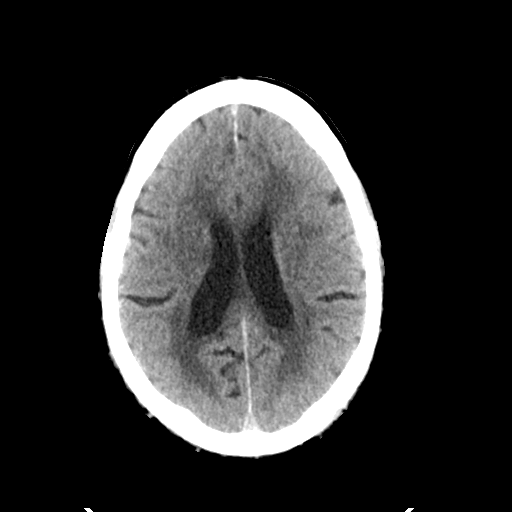
[im 25/40  brain]
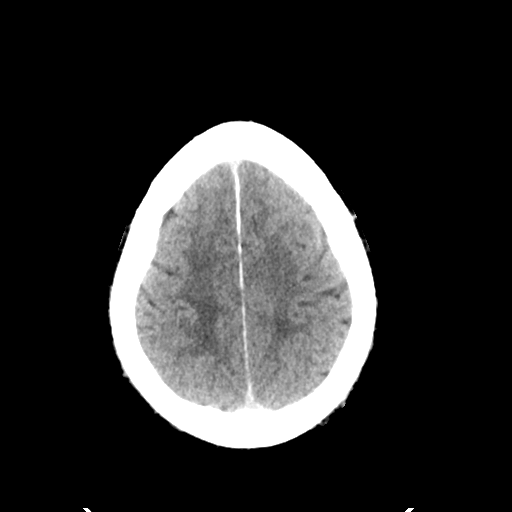
[im 25/40  bone]
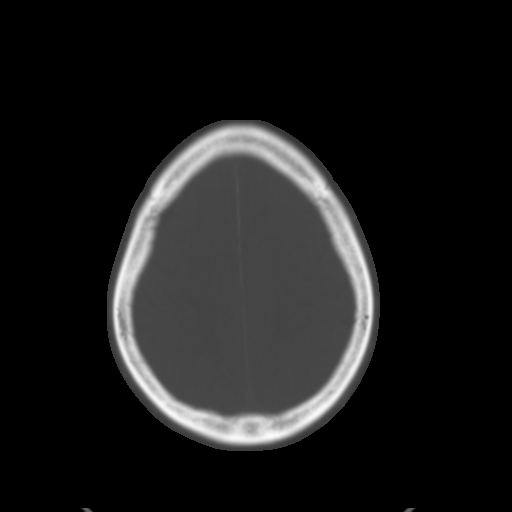
[im 30/40  brain]
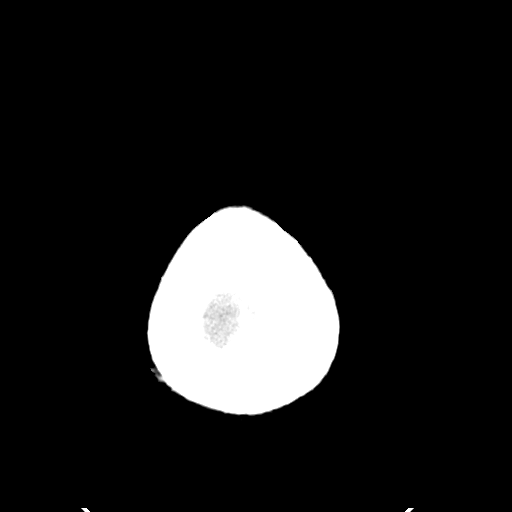
[im 35/40  brain]
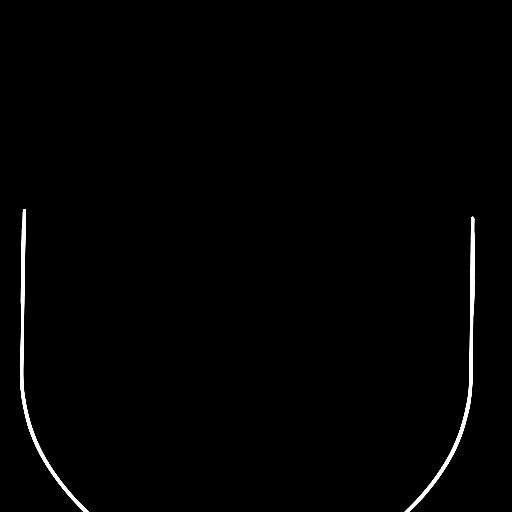

[Series 4: head bone · axial · 0.50mm/px · z∈[+172,+212]mm · 3 of 99 slices shown]
[im 10/99  bone]
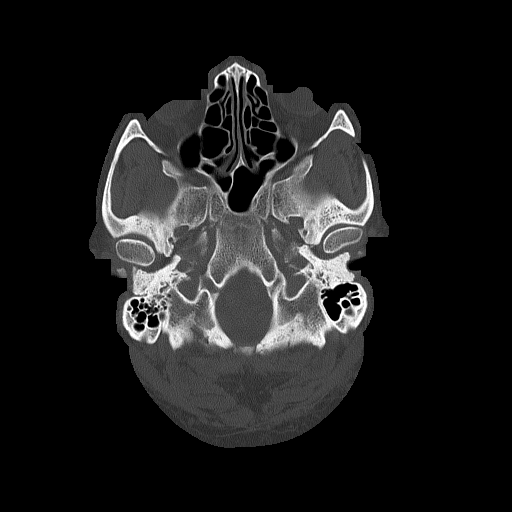
[im 20/99  bone]
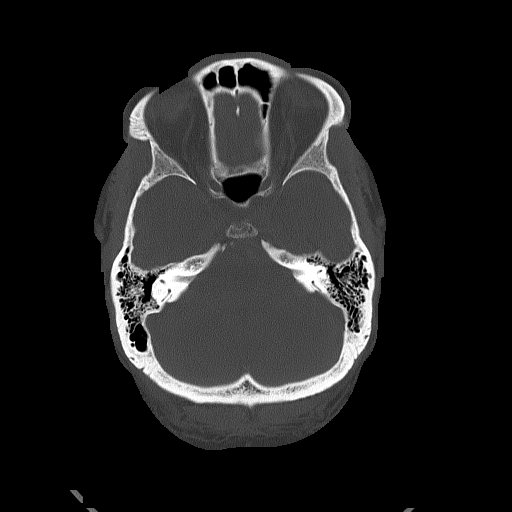
[im 30/99  bone]
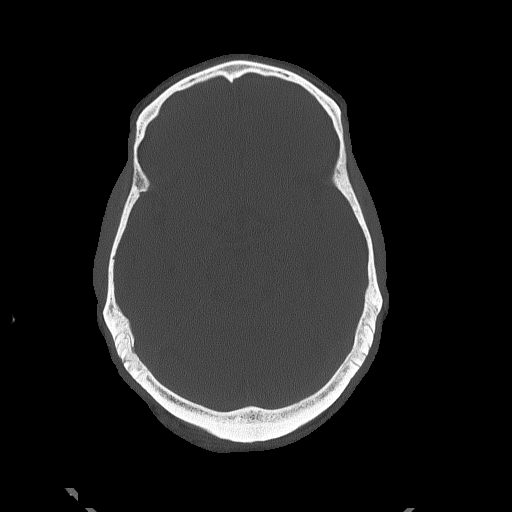

[Series 5: head without cor · coronal · non-contrast · 0.35mm/px · 3 of 74 slices shown]
[im 25/74  brain]
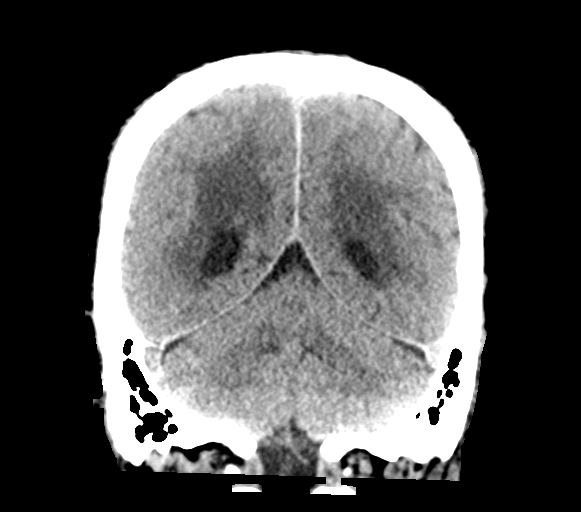
[im 33/74  brain]
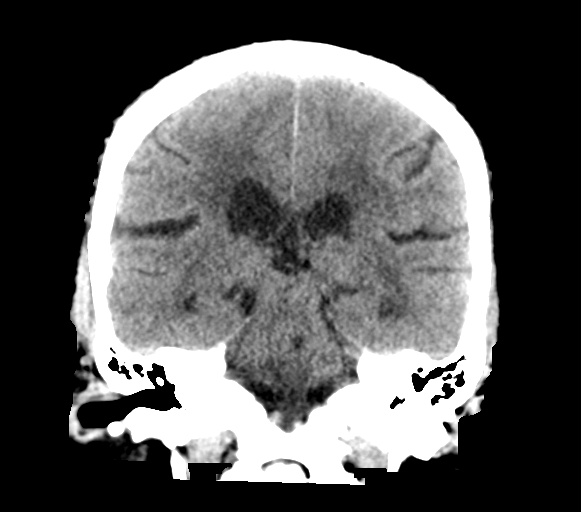
[im 41/74  brain]
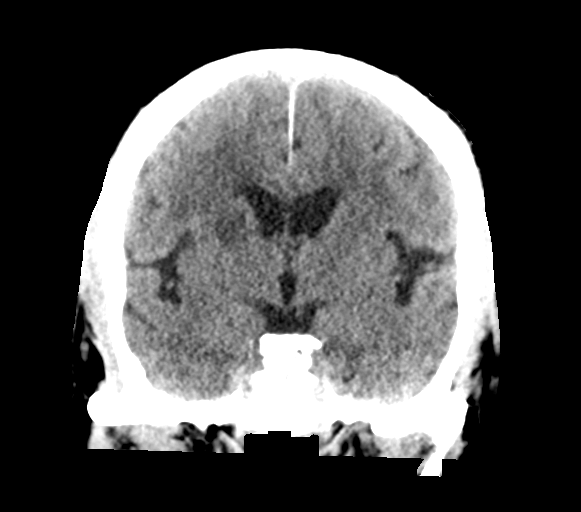

[Series 6: head without sag · sagittal · non-contrast · 0.39mm/px · 3 of 67 slices shown]
[im 23/67  brain]
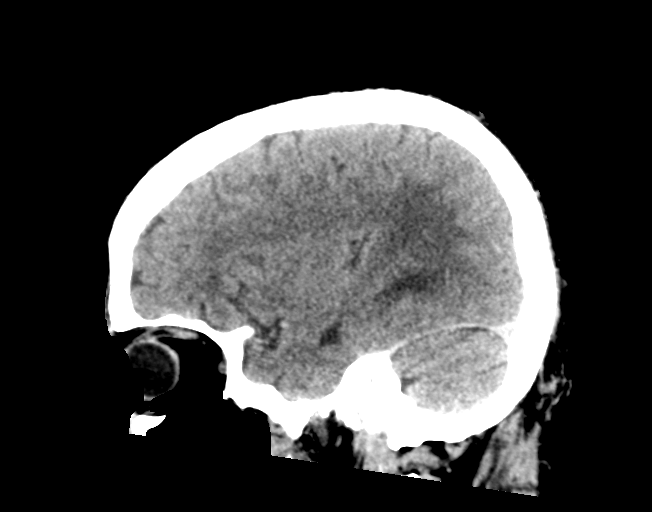
[im 34/67  brain]
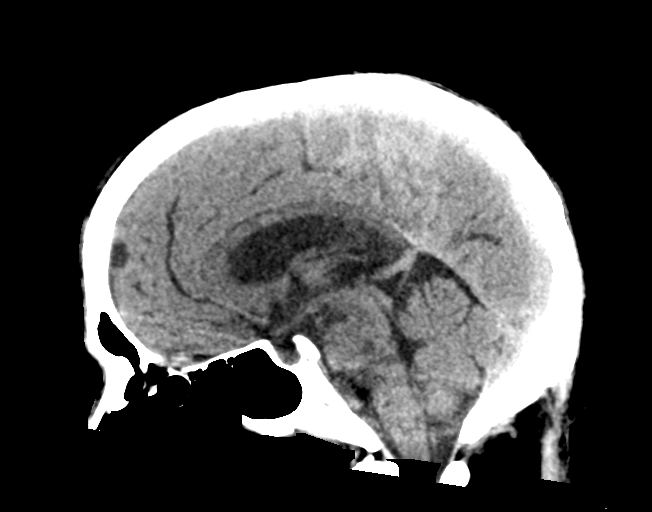
[im 45/67  brain]
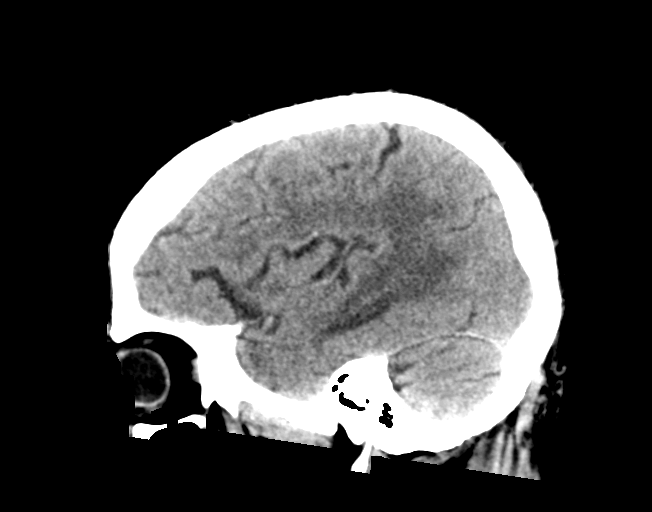

[16 of 47 positions shown; findings below may reference images not displayed]

FINDINGS: Brain: No acute hemorrhage. Expected evolution of recent right
caudate tail infarct from prior MRI. Stable advanced chronic small
vessel ischemic change in multiple remote lacunar infarcts. No
evidence of new ischemia. No hydrocephalus, midline shift, or mass
effect. No subdural or extra-axial collection.

Vascular: There is no hyperdense vessel.

Skull: No fracture or focal lesion.

Sinuses/Orbits: Paranasal sinuses and mastoid air cells are clear.
The visualized orbits are unremarkable.

Other: None.
IMPRESSION: 1. Expected evolution of recent right caudate tail infarct from
prior MRI. No hemorrhage.
2. Stable advanced chronic small vessel ischemic change and multiple
remote lacunar infarcts.

## 2020-12-22 MED ORDER — LABETALOL HCL 5 MG/ML IV SOLN
5.0000 mg | Freq: Once | INTRAVENOUS | Status: AC
  Administered 2020-12-22: 5 mg via INTRAVENOUS
  Filled 2020-12-22: qty 4

## 2020-12-22 NOTE — ED Notes (Signed)
Pt to CT

## 2020-12-22 NOTE — H&P (Addendum)
History and Physical    Gary Mayer TIR:[id number] DOB: 23-Oct-1955 DOA: 12/22/2020  PCP: Pcp, No    Patient coming from:  Home   Chief Complaint:  Hypertensive urgency.   HPI: Gary Mayer is a 65 y.o. male seen in ed with complaints of Elevated bp and headache at home.  Patient was recently admitted for an acute ischemic stroke and discharged on amlodipine Aldactone regimen.  Patient has been taking his medications at home and when he went to physical therapy later today his pressure was noted to be extremely elevated and was told to come to the emergency room.  Patient denies any numbness weakness paresthesia slurred speech gait difficulty any neurological symptoms.  He does have some residual facial droop and difficulty swallowing since his earlier stroke.  Patient currently lives with sister and states that patient is compliant.  Review of systems otherwise negative. Per sister at bedside pt has been aspirating and coughing since discharge and has had inappropriate outbursts.No falls or any other new symptoms.  Pt has past medical history of chronic kidney disease stage III, congestive heart failure-diastolic, hypertension, kidney stone.  ED Course:  Vitals:   12/22/20 2006 12/22/20 2200 12/22/20 2230 12/22/20 2300  BP: (!) 202/102 (!) 172/109 (!) 182/107 (!) 190/115  Pulse: 80 63 66 63  Resp: 17 15 13 15   Temp:      TempSrc:      SpO2: 99% 97% 98% 98%  In the emergency room patient's vitals as above initial heart rate was 80s and patient was given labetalol for his high blood pressure.  Patient is alert awake oriented afebrile. Blood work shows normal CBC, CMP shows creatinine of 1.60 which is an improvement from previous of 1.77. CT head [place] is negative for any acute finding.    Review of Systems:  Review of Systems  Constitutional: Negative.   HENT: Negative.   Eyes: Negative.   Respiratory: Negative.   Cardiovascular: Negative.   Gastrointestinal: Negative.    Musculoskeletal: Negative.   Neurological: Positive for speech change and headaches. Negative for tremors, focal weakness, seizures, loss of consciousness and weakness.  All other systems reviewed and are negative.    Past Medical History:  Diagnosis Date  . CKD (chronic kidney disease) stage 3, GFR 30-59 ml/min (HCC)   . Diastolic CHF (HCC)   . Hypertension   . Kidney stone     History reviewed. No pertinent surgical history.   reports that he has never smoked. He has never used smokeless tobacco. He reports that he does not drink alcohol and does not use drugs.  No Known Allergies  Family History  Problem Relation Age of Onset  . Hypertension Sister   . Hypertension Other     Prior to Admission medications   Medication Sig Start Date End Date Taking? Authorizing Provider  amLODipine (NORVASC) 10 MG tablet Take 1 tablet (10 mg total) by mouth daily. 12/17/20   02/16/21, MD  aspirin 81 MG EC tablet Take 1 tablet (81 mg total) by mouth daily. Swallow whole. 12/18/20   02/17/21, MD  atorvastatin (LIPITOR) 40 MG tablet Take 1 tablet (40 mg total) by mouth daily. 12/18/20   02/17/21, MD  clopidogrel (PLAVIX) 75 MG tablet Take 1 tablet (75 mg total) by mouth daily. 12/18/20   02/17/21, MD  lidocaine (LIDODERM) 5 % Place 1 patch onto the skin daily as needed (pain). Patient not taking: Reported on 12/22/2020 01/17/18  [provider]  LUTEIN PO Take 1 capsule by mouth daily. Patient not taking: Reported on 12/22/2020    [provider]  Multiple Vitamins-Minerals (ONE-A-DAY MENS 50+) TABS Take 1 tablet by mouth daily. Patient not taking: Reported on 12/22/2020    [provider]  OVER THE COUNTER MEDICATION Take 2 tablets by mouth daily. Kyolic cardiovascular vitamin garlic based    [provider]  spironolactone (ALDACTONE) 50 MG tablet Take 1 tablet (50 mg total) by mouth daily. 12/17/20   Reva Bores, MD    Physical  Exam: Vitals:   12/22/20 2006 12/22/20 2200 12/22/20 2230 12/22/20 2300  BP: (!) 202/102 (!) 172/109 (!) 182/107 (!) 190/115  Pulse: 80 63 66 63  Resp: 17 15 13 15   Temp:      TempSrc:      SpO2: 99% 97% 98% 98%   Physical Exam Vitals and nursing note reviewed.  Constitutional:      General: He is not in acute distress.    Appearance: Normal appearance. He is not ill-appearing, toxic-appearing or diaphoretic.  HENT:     Head: Normocephalic and atraumatic.     Right Ear: External ear normal.     Left Ear: External ear normal.     Nose: Nose normal.     Mouth/Throat:     Mouth: Mucous membranes are moist.  Eyes:     Extraocular Movements: Extraocular movements intact.     Pupils: Pupils are equal, round, and reactive to light.  Neck:     Vascular: No carotid bruit.  Cardiovascular:     Rate and Rhythm: Normal rate and regular rhythm.     Pulses: Normal pulses.     Heart sounds: Normal heart sounds.  Pulmonary:     Effort: Pulmonary effort is normal.     Breath sounds: Normal breath sounds.  Abdominal:     General: Bowel sounds are normal. There is no distension.     Palpations: Abdomen is soft. There is no mass.     Tenderness: There is no abdominal tenderness. There is no guarding.     Hernia: No hernia is present.  Musculoskeletal:        General: No swelling or deformity.     Right lower leg: No edema.     Left lower leg: No edema.  Skin:    General: Skin is warm.  Neurological:     General: No focal deficit present.     Mental Status: He is alert. He is disoriented.     Cranial Nerves: Cranial nerve deficit present.     Motor: Motor function is intact. No weakness.     Deep Tendon Reflexes:     Reflex Scores:      Bicep reflexes are 3+ on the right side and 3+ on the left side.      Patellar reflexes are 3+ on the right side and 3+ on the left side.    Comments: Left facial droop. Inability to close mouth completely due to droop. Eomi/perrla. Tongue deviated  to left side.  Strength wnl in all four ext ext and flexion.     Psychiatric:        Mood and Affect: Mood normal.        Behavior: Behavior normal.    Labs on Admission: I have personally reviewed following labs and imaging studies  No results for input(s): CKTOTAL, CKMB, TROPONINI in the last 72 hours. Lab Results  Component Value Date  WBC 7.1 12/22/2020   HGB 15.0 12/22/2020   HCT 45.2 12/22/2020   MCV 87.6 12/22/2020   PLT 203 12/22/2020    Recent Labs  Lab 12/22/20 1452  NA 135  K 4.2  CL 105  CO2 24  BUN 20  CREATININE 1.60*  CALCIUM 9.4  GLUCOSE 99   Lab Results  Component Value Date   CHOL 193 12/15/2020   HDL 56 12/15/2020   LDLCALC 131 (H) 12/15/2020   TRIG 32 12/15/2020   No results found for: DDIMER Invalid input(s): POCBNP  Urinalysis    Component Value Date/Time   COLORURINE YELLOW 12/14/2020 1921   APPEARANCEUR CLEAR 12/14/2020 1921   LABSPEC 1.011 12/14/2020 1921   PHURINE 7.0 12/14/2020 1921   GLUCOSEU NEGATIVE 12/14/2020 1921   HGBUR MODERATE (A) 12/14/2020 1921   BILIRUBINUR NEGATIVE 12/14/2020 1921   KETONESUR NEGATIVE 12/14/2020 1921   PROTEINUR 30 (A) 12/14/2020 1921   NITRITE NEGATIVE 12/14/2020 1921   LEUKOCYTESUR NEGATIVE 12/14/2020 1921   COVID-19 Labs  No results for input(s): DDIMER, FERRITIN, LDH, CRP in the last 72 hours.  Lab Results  Component Value Date   SARSCOV2NAA NEGATIVE 12/14/2020    Radiological Exams on Admission: CT Head Wo Contrast  Result Date: 12/22/2020 CLINICAL DATA:  Neuro deficit, acute, stroke suspected Mental status change, unknown cause Patient reports hypertension.  Recent stroke. EXAM: CT HEAD WITHOUT CONTRAST TECHNIQUE: Contiguous axial images were obtained from the base of the skull through the vertex without intravenous contrast. COMPARISON:  Head CT and brain MRI 12/14/2020 FINDINGS: Brain: No acute hemorrhage. Expected evolution of recent right caudate tail infarct from prior MRI. Stable  advanced chronic small vessel ischemic change in multiple remote lacunar infarcts. No evidence of new ischemia. No hydrocephalus, midline shift, or mass effect. No subdural or extra-axial collection. Vascular: There is no hyperdense vessel. Skull: No fracture or focal lesion. Sinuses/Orbits: Paranasal sinuses and mastoid air cells are clear. The visualized orbits are unremarkable. Other: None. IMPRESSION: 1. Expected evolution of recent right caudate tail infarct from prior MRI. No hemorrhage. 2. Stable advanced chronic small vessel ischemic change and multiple remote lacunar infarcts. Electronically Signed   By: Narda RutherfordMelanie  Sanford M.D.   On: 12/22/2020 20:58    EKG: Independently reviewed.  None today   Echocardiogram 12/2020: IMPRESSIONS  1. Left ventricular ejection fraction, by estimation, is 60 to 65%. The  left ventricle has normal function. The left ventricle has no regional  wall motion abnormalities. There is mild left ventricular hypertrophy.  Left ventricular diastolic parameters  were normal.  2. Right ventricular systolic function is normal. The right ventricular  size is normal.  3. Left atrial size was mildly dilated.  4. The mitral valve is normal in structure. No evidence of mitral valve  regurgitation. No evidence of mitral stenosis.  5. The aortic valve is tricuspid. There is mild calcification of the  aortic valve. Aortic valve regurgitation is not visualized. Mild aortic  valve sclerosis is present, with no evidence of aortic valve stenosis.  6. The inferior vena cava is normal in size with greater than 50%  respiratory variability, suggesting right atrial pressure of 3 mmHg.   Assessment/Plan Principal Problem:   Hypertensive urgency Active Problems:   Acute ischemic stroke (HCC)   CKD (chronic kidney disease) stage 3, GFR 30-59 ml/min (HCC)   Dysphagia   Hypertensive urgency: We will admit patient in observation status for 24-hour monitoring of blood pressure  response to change in regimen and initiation  of possible diuretic therapy if for isolated systolic hypertension.  Patient is status postacute ischemic CVA and goal blood pressure in this gentleman will be from 120s to 130s systolic blood pressure. We will continue patient on his Aldactone, start patient on hydralazine 50 mg every 8h, Will hold amlodipine and if blood pressure still elevated will change to Bystolic which is more potent than amlodipine.  We will also monitor electrolytes and kidney function.   Acute ischemic stroke: Patient has difficulty with swallowing, will follow previous speech therapy recommendations. Fall aspiration precautions.  Continue patient on aspirin and Plavix and primary care to monitor cessation of dual antiplatelet therapy and continuation of either aspirin or either Plavix.   Chronic kidney disease stage III:  We are restricted for ACE inhibitor and ARB therapy in this case due to chronic kidney disease. Initiation and trial with low-dose ACE inhibitor and ARB is possible however the rise of creatinine can occur up to 10 days post initiation of ACE and ARB therapy. Primary care may modify regimen by initiation of low-dose ACE or ARB for nephro protection. But primarily our goal will be blood pressure reduction while in the hospital under monitored monitored conditions.  Dysphagia: Due to new symptom of elevated bp and dysphagia we will admit and reevaluate for cva initial head ct negative , will obtain MRI ? New areas of infarct.  We will cont DAPT with rectal aspirin and Plavix once pt has passed swallow eval and speech therapy consult.    DVT prophylaxis:  SCD'd  Code Status:  Full code   Family Communication:  TEE, WAY (Sister)  [phone] (Mobile)   Disposition Plan:  TBD.  Consults called:  None.   Admission status: Observation.     Gertha Calkin MD [facility] [phone] How to contact the [facility] Attending or Consulting  provider 7A - 7P or covering provider during after hours 7P -7A, for this patient.    1. Check the care team in [facility] and look for a) attending/consulting [facility] provider listed and b) the [facility] team listed 2. Log into [email or web address] and use [place]'s universal password to access. If you do not have the password, please contact the hospital operator. 3. Locate the [facility] provider you are looking for under [facility] and page to a number that you can be directly reached. 4. If you still have difficulty reaching the provider, please page the [facility] (Director on Call) for the Hospitalists listed on amion for assistance. [email or web address] Password [email or web address] 12/22/2020, 11:57 PM

## 2020-12-22 NOTE — ED Provider Notes (Signed)
Emergency Medicine Provider Triage Evaluation Note  Gary Mayer 65 y.o. male was evaluated in triage.  Pt complains of hypertension.  Recent stroke ago.  Blood pressure medication was uncontrolled at that time.  Was discharged home and states he has been taking his medications.  Went to start PT today and was noted to be hypertensive and was sent to the emergency department for further evaluation.  He has some left-sided deficits/facial droop that are from his previous stroke.  Denies any complaints at this time.  No new chest pain, difficulty breathing, numbness/weakness, difficulty speaking.   Review of Systems  Positive: Hypertension Negative: Chest pain, difficulty breathing, numbness, facial droop.  Physical Exam  BP 134/82   Pulse 70   Temp 98.2 F (36.8 C) (Oral)   Resp 18   Ht 5\' 4"  (1.626 m)   Wt 65.8 kg   SpO2 100%   BMI 24.89 kg/m  Gen:   Awake, no distress   HEENT:  Atraumatic  Resp:  Normal effort  Cardiac:  Normal rate, rhythm Abd:   Nondistended, nontender  MSK:   Moves extremities without difficulty  Neuro:  Speech clear.  Slight left-sided facial droop, left-sided weakness of upper lower extremity which is baseline.  5/5 strength in upper and right lower extremity.  Other:  N/A   Medical Decision Making  Medically screening exam initiated at 2:57 PM  Appropriate orders placed.  Gary Mayer was informed that the remainder of the evaluation will be completed by another provider, this initial triage assessment does not replace that evaluation, and the importance of remaining in the ED until their evaluation is complete.   Clinical Impression  HTN   Portions of this note were generated with Dragon dictation software. Dictation errors may occur despite best attempts at proofreading.     Caroleen Hamman, PA-C 12/22/20 1458    02/21/21, MD 12/22/20 [phone]

## 2020-12-22 NOTE — Therapy (Signed)
[facility] [address] [place], [place], [address] Phone: [phone]   Fax:  [phone]  Physical Therapy Evaluation - Arrived No Charge  Patient Details  Name: Gary Mayer MRN: [id number] Date of Birth: 01-Mar-1956 No data recorded  Encounter Date: 12/22/2020   PT End of Session - 12/22/20 1636    Visit Number 1   arrived no charge   Number of Visits 1    PT Start Time 1401    PT Stop Time 1431    PT Time Calculation (min) 30 min           Past Medical History:  Diagnosis Date  . CKD (chronic kidney disease) stage 3, GFR 30-59 ml/min (HCC)   . Diastolic CHF (HCC)   . Hypertension   . Kidney stone     History reviewed. No pertinent surgical history.  Vitals:   12/22/20 1407 12/22/20 1415  BP: (!) 196/115 (!) 190/110  Pulse: 77       Pt arrived to PT eval with sister. Assessed pt's BP with automatic cuff and then with manual cuff (see values above). Pt reports that he took his BP medication that he got from the hospital everyday since he was discharged on 12/17/20. Pt's sister states they have not been checking his BP at home. Pt asymptomatic throughout and educated on the signs/sx of a CVA. Pt is from [facility] and had CVA when down in [place] visiting - only has a PCP up back in [place], but has not been in 2 years. No PCP follow-up was scheduled after pt's hospitalization. Pt's sister states that they said Tinnie Gens, MD would be pt's PCP after his hospital stay. PT called Dr. Tawni Levy office about this information, was told that this was not the case and Dr. Shawnie Pons was just the hospitalist that day. Stated that pt would need to go to ER or urgent care. Discussed with pt and pt's sister that due to elevated BP, recent CVA, and pt already taking his BP medications that it would be best to go to the ER. Also printed out phone number for number to get set up for a PCP through [facility] for BP management while pt is in  [place]. Pt left with pt's daughter taking him to the ER.                 Objective measurements completed on examination: See above findings.                             Patient will benefit from skilled therapeutic intervention in order to improve the following deficits and impairments:     Visit Diagnosis: Other abnormalities of gait and mobility     Problem List Patient Active Problem List   Diagnosis Date Noted  . Acute ischemic stroke (HCC) 12/14/2020  . HTN (hypertension) 12/14/2020  . CKD (chronic kidney disease) stage 3, GFR 30-59 ml/min (HCC) 12/14/2020    Drake Leach, PT, DPT  12/22/2020, 4:37 PM  [facility] [address] [place], [place], [address] Phone: [phone]   Fax:  [phone]  Name: Gary Mayer MRN: [id number] Date of Birth: 08-03-1956

## 2020-12-22 NOTE — ED Triage Notes (Signed)
Pt BIB sister due to pt bp.Pt was suppose to start PT today but due to bp he couldn't.

## 2020-12-22 NOTE — ED Provider Notes (Signed)
[facility] EMERGENCY DEPARTMENT Provider Note   CSN: [id number] Arrival date & time: 12/22/20  1441     History Chief Complaint  Patient presents with  . Hypertension    Gary Mayer is a 65 y.o. male presenting for evaluation of elevated blood pressure.  Patient states he was recently admitted with a stroke.  He has been taking his blood pressure medication as prescribed.  He took his medicine around 9:00 this morning.  He went to physical therapy around 2:00, and his blood pressure was noted to be very elevated, and he was told to come to the ER.  He reports no new weakness or numbness.  He continues to have some residual facial droop and difficulty swallowing.  He does report a mild headache which is intermittent, not new for him.  He has not taken anything for this.  He has not taken any more medicine for his blood pressure.  He denies chest pain, shortness of breath, cough, nausea, vomiting, abdominal pain, urinary symptoms, abnormal bowel movements.  HPI     Past Medical History:  Diagnosis Date  . CKD (chronic kidney disease) stage 3, GFR 30-59 ml/min (HCC)   . Diastolic CHF (HCC)   . Hypertension   . Kidney stone     Patient Active Problem List   Diagnosis Date Noted  . Hypertensive urgency 12/22/2020  . Acute ischemic stroke (HCC) 12/14/2020  . HTN (hypertension) 12/14/2020  . CKD (chronic kidney disease) stage 3, GFR 30-59 ml/min (HCC) 12/14/2020    History reviewed. No pertinent surgical history.     Family History  Problem Relation Age of Onset  . Hypertension Sister   . Hypertension Other     Social History   Tobacco Use  . Smoking status: Never Smoker  . Smokeless tobacco: Never Used  Substance Use Topics  . Alcohol use: Never  . Drug use: Never    Home Medications Prior to Admission medications   Medication Sig Start Date End Date Taking? Authorizing Provider  amLODipine (NORVASC) 10 MG tablet Take 1 tablet (10 mg total)  by mouth daily. 12/17/20  Yes Reva Bores, MD  aspirin 81 MG EC tablet Take 1 tablet (81 mg total) by mouth daily. Swallow whole. 12/18/20  Yes Reva Bores, MD  atorvastatin (LIPITOR) 40 MG tablet Take 1 tablet (40 mg total) by mouth daily. 12/18/20  Yes Reva Bores, MD  clopidogrel (PLAVIX) 75 MG tablet Take 1 tablet (75 mg total) by mouth daily. 12/18/20  Yes Reva Bores, MD  OVER THE COUNTER MEDICATION Take 2 tablets by mouth daily. Kyolic cardiovascular vitamin garlic based   Yes [provider]  spironolactone (ALDACTONE) 50 MG tablet Take 1 tablet (50 mg total) by mouth daily. 12/17/20  Yes Reva Bores, MD  lidocaine (LIDODERM) 5 % Place 1 patch onto the skin daily as needed (pain). Patient not taking: Reported on 12/22/2020 01/17/18   [provider]  LUTEIN PO Take 1 capsule by mouth daily. Patient not taking: Reported on 12/22/2020    [provider]  Multiple Vitamins-Minerals (ONE-A-DAY MENS 50+) TABS Take 1 tablet by mouth daily. Patient not taking: Reported on 12/22/2020    [provider]    Allergies    Patient has no known allergies.  Review of Systems   Review of Systems  Neurological: Positive for headaches.  All other systems reviewed and are negative.   Physical Exam Updated Vital Signs BP (!) 182/107  Pulse 66   Temp 99.1 F (37.3 C) (Oral)   Resp 13   SpO2 98%   Physical Exam Vitals and nursing note reviewed.  Constitutional:      General: He is not in acute distress.    Appearance: He is well-developed.     Comments: Resting in the bed in NAD  HENT:     Head: Normocephalic and atraumatic.  Eyes:     Conjunctiva/sclera: Conjunctivae normal.     Pupils: Pupils are equal, round, and reactive to light.  Cardiovascular:     Rate and Rhythm: Normal rate and regular rhythm.     Pulses: Normal pulses.  Pulmonary:     Effort: Pulmonary effort is normal. No respiratory distress.     Breath sounds: Normal breath sounds.  No wheezing.  Abdominal:     General: There is no distension.     Palpations: Abdomen is soft. There is no mass.     Tenderness: There is no abdominal tenderness. There is no guarding or rebound.  Musculoskeletal:        General: Normal range of motion.     Cervical back: Normal range of motion and neck supple.  Skin:    General: Skin is warm and dry.     Capillary Refill: Capillary refill takes less than 2 seconds.  Neurological:     Mental Status: He is alert and oriented to person, place, and time.     GCS: GCS eye subscore is 4. GCS verbal subscore is 5. GCS motor subscore is 6.     Comments: Slight left lower facial weakness.  Finger-to-nose intact but slow on the left when compared to the right.  Grip strength equal bilaterally.  Sensation intact x4. Lower extremity strength equal bilaterally.      ED Results / Procedures / Treatments   Labs (all labs ordered are listed, but only abnormal results are displayed) Labs Reviewed  BASIC METABOLIC PANEL - Abnormal; Notable for the following components:      Result Value   Creatinine, Ser 1.60 (*)    GFR, Estimated 48 (*)    All other components within normal limits  SARS CORONAVIRUS 2 (TAT 6-24 HRS)  CBC WITH DIFFERENTIAL/PLATELET    EKG None  Radiology CT Head Wo Contrast  Result Date: 12/22/2020 CLINICAL DATA:  Neuro deficit, acute, stroke suspected Mental status change, unknown cause Patient reports hypertension.  Recent stroke. EXAM: CT HEAD WITHOUT CONTRAST TECHNIQUE: Contiguous axial images were obtained from the base of the skull through the vertex without intravenous contrast. COMPARISON:  Head CT and brain MRI 12/14/2020 FINDINGS: Brain: No acute hemorrhage. Expected evolution of recent right caudate tail infarct from prior MRI. Stable advanced chronic small vessel ischemic change in multiple remote lacunar infarcts. No evidence of new ischemia. No hydrocephalus, midline shift, or mass effect. No subdural or extra-axial  collection. Vascular: There is no hyperdense vessel. Skull: No fracture or focal lesion. Sinuses/Orbits: Paranasal sinuses and mastoid air cells are clear. The visualized orbits are unremarkable. Other: None. IMPRESSION: 1. Expected evolution of recent right caudate tail infarct from prior MRI. No hemorrhage. 2. Stable advanced chronic small vessel ischemic change and multiple remote lacunar infarcts. Electronically Signed   By: Narda Rutherford M.D.   On: 12/22/2020 20:58    Procedures Procedures   Medications Ordered in ED Medications  labetalol (NORMODYNE) injection 5 mg (5 mg Intravenous Given 12/22/20 2033)    ED Course  I have reviewed the triage vital signs and  the nursing notes.  Pertinent labs & imaging results that were available during my care of the patient were reviewed by me and considered in my medical decision making (see chart for details).    MDM Rules/Calculators/A&P                          Patient resenting for evaluation of elevated blood pressure.  On exam, patient peers nontoxic.  There does not appear to be any new focal neurologic deficit.  When compared to his exam on 5/4, neuro exam is the same.  However patient is reporting a headache, will obtain a head CT.  He is hypertensive, though this is improving without medication.  Labs obtained from triage interpreted by me, overall reassuring.  Mild elevation of creatinine, however this is his baseline. Discussed with Dr. Amada Jupiter from neurology, who recommends a one-time dose of labetalol for blood pressure control.   CT head negative for acute findings, does show changes consistent with previous stroke.  On reevaluation, patient reports headache is improved.  Blood pressure improved slightly with labetalol, systolic to 168, however soon returned back to 180s to 190s.  Discussed findings with patient and patient's sister.  Patient does not live local, and does not have close PCP follow-up.  Patient's history is  extremely concerned about patient's persistently elevated blood pressure despite taking medicine at home.  In the setting of recent stroke and persistently elevated blood pressure, it is reasonable to discuss with hospitalist service for admission.  Discussed with Dr. Allena Katz from [facility] hospitalist service, patient to be admitted  Final Clinical Impression(s) / ED Diagnoses Final diagnoses:  Hypertension, unspecified type  History of recent stroke    Rx / DC Orders ED Discharge Orders    None       Alveria Apley, PA-C 12/22/20 2337    Tegeler, Canary Brim, MD 12/22/20 [phone]

## 2020-12-23 ENCOUNTER — Observation Stay (HOSPITAL_COMMUNITY): Payer: Federal, State, Local not specified - PPO

## 2020-12-23 DIAGNOSIS — I160 Hypertensive urgency: Secondary | ICD-10-CM

## 2020-12-23 LAB — BASIC METABOLIC PANEL
Anion gap: 7 (ref 5–15)
BUN: 17 mg/dL (ref 8–23)
CO2: 28 mmol/L (ref 22–32)
Calcium: 9.6 mg/dL (ref 8.9–10.3)
Chloride: 104 mmol/L (ref 98–111)
Creatinine, Ser: 1.58 mg/dL — ABNORMAL HIGH (ref 0.61–1.24)
GFR, Estimated: 49 mL/min — ABNORMAL LOW (ref >60)
Glucose, Bld: 96 mg/dL (ref 70–99)
Potassium: 4.2 mmol/L (ref 3.5–5.1)
Sodium: 139 mmol/L (ref 135–145)

## 2020-12-23 LAB — CBC
HCT: 43.3 % (ref 39.0–52.0)
Hemoglobin: 14.6 g/dL (ref 13.0–17.0)
MCH: 29.6 pg (ref 26.0–34.0)
MCHC: 33.7 g/dL (ref 30.0–36.0)
MCV: 87.7 fL (ref 80.0–100.0)
Platelets: 178 10*3/uL (ref 150–400)
RBC: 4.94 MIL/uL (ref 4.22–5.81)
RDW: 13.2 % (ref 11.5–15.5)
WBC: 6.7 10*3/uL (ref 4.0–10.5)
nRBC: 0 % (ref 0.0–0.2)

## 2020-12-23 LAB — SARS CORONAVIRUS 2 (TAT 6-24 HRS): SARS Coronavirus 2: NEGATIVE

## 2020-12-23 IMAGING — MR MR HEAD W/O CM
12 of 13 series · 44 of 48 positions shown · non-contrast
Comparison: Prior CT from [DATE] as well as recent MRI from
[DATE].

CLINICAL DATA: Initial evaluation for acute delirium.

EXAM:
MRI HEAD WITHOUT CONTRAST
TECHNIQUE: Multiplanar, multiecho pulse sequences of the brain and surrounding
structures were obtained without intravenous contrast.

[Series 5: DWI · axial · 3.0mm · 0.88mm/px · z∈[-93,+59]mm · 7 of 104 slices shown (1 of 4)]
[im 1/104]
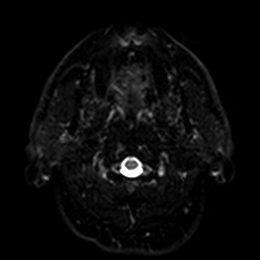
[im 18/104]
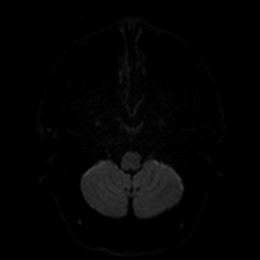
[im 35/104]
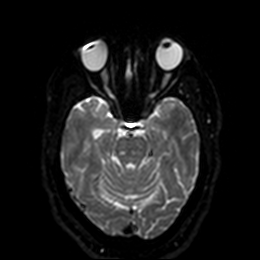
[im 52/104]
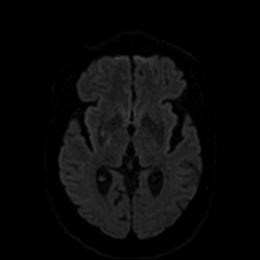
[im 69/104]
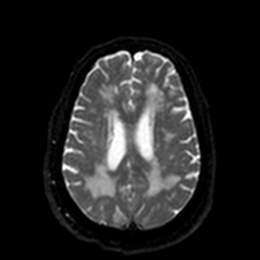
[im 86/104]
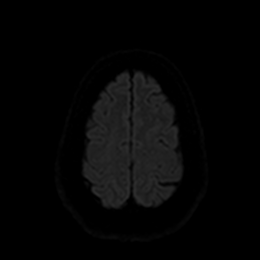
[im 104/104]
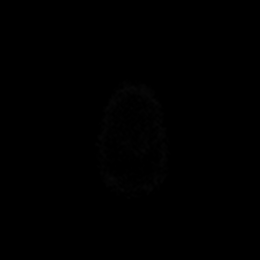

[Series 6: DWI · axial · 3.0mm · 0.88mm/px · z∈[-93,+59]mm · 4 of 52 slices shown (2 of 4)]
[im 1/52]
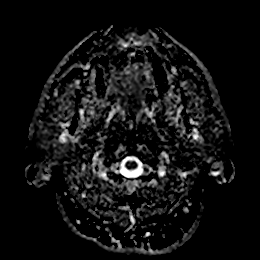
[im 18/52]
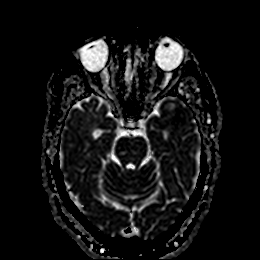
[im 35/52]
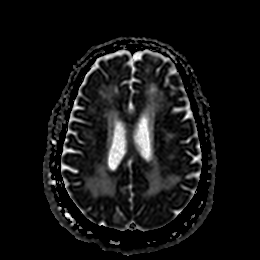
[im 52/52]
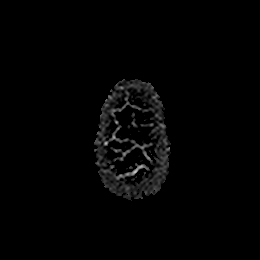

[Series 7: DWI · coronal · 4.0mm · 0.88mm/px · 6 of 76 slices shown (3 of 4)]
[im 1/76]
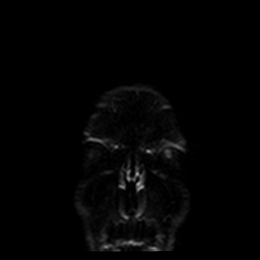
[im 16/76]
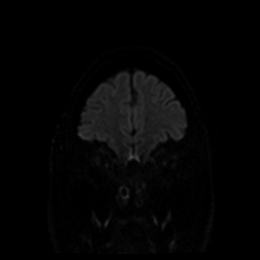
[im 31/76]
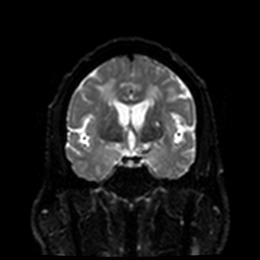
[im 46/76]
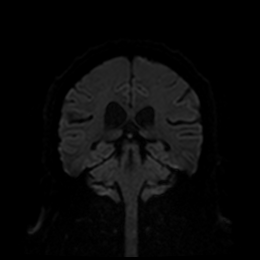
[im 61/76]
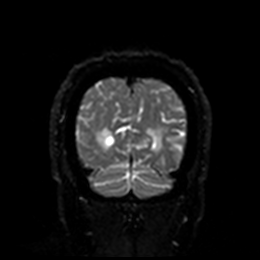
[im 76/76]
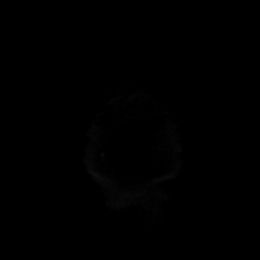

[Series 8: DWI · coronal · 4.0mm · 0.88mm/px · 3 of 38 slices shown (4 of 4)]
[im 1/38]
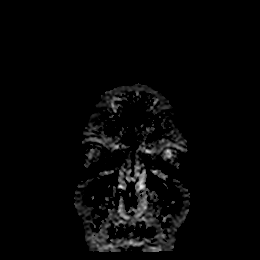
[im 19/38]
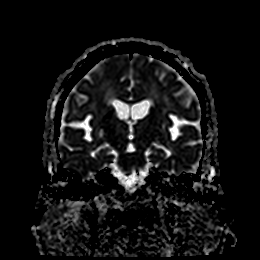
[im 38/38]
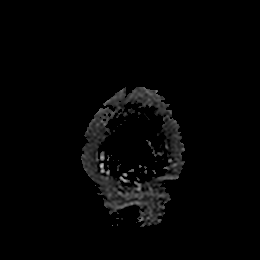

[Series 9: T1 · sagittal · 5.0mm · 0.75mm/px · 2 of 23 slices shown]
[im 1/23]
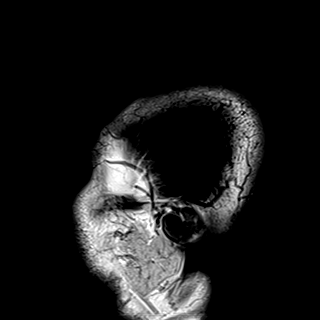
[im 23/23]
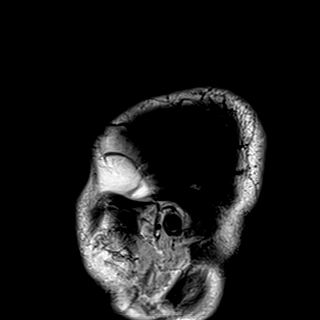

[Series 10: T2 · axial · 5.0mm · 0.72mm/px · z∈[-98,+63]mm · 2 of 28 slices shown (1 of 2)]
[im 1/28]
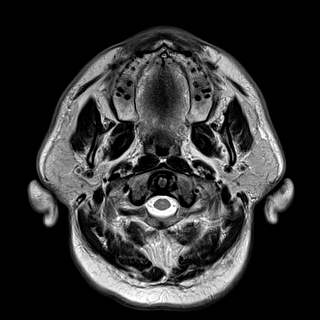
[im 28/28]
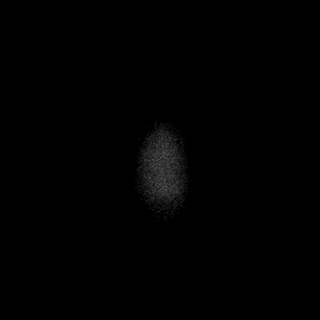

[Series 11: FLAIR · axial · 5.0mm · 0.45mm/px · z∈[-98,+63]mm · 2 of 28 slices shown]
[im 1/28]
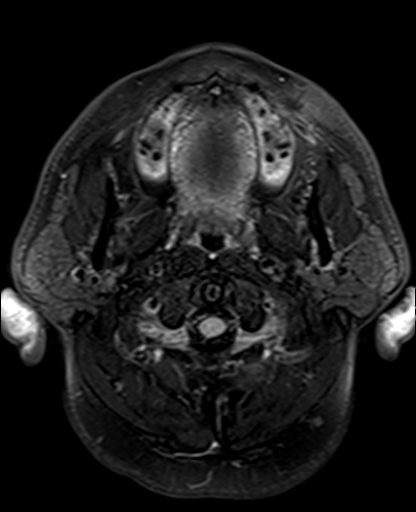
[im 28/28]
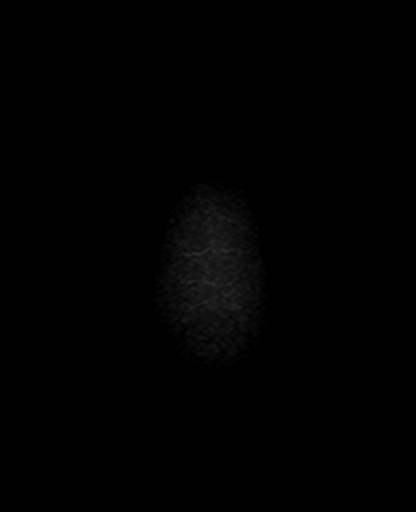

[Series 12: mag_images · axial · 3.0mm · 0.90mm/px · z∈[-100,+64]mm · 4 of 56 slices shown]
[im 1/56]
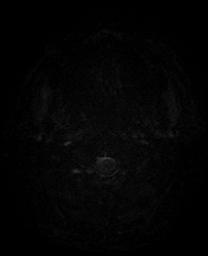
[im 19/56]
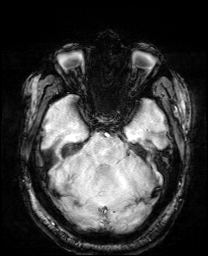
[im 37/56]
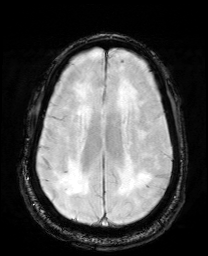
[im 56/56]
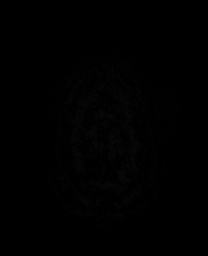

[Series 13: pha_images · axial · 3.0mm · 0.90mm/px · z∈[-100,+58]mm · 4 of 54 slices shown]
[im 1/54]
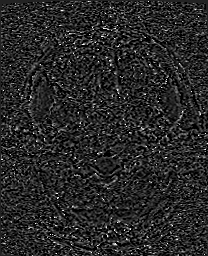
[im 18/54]
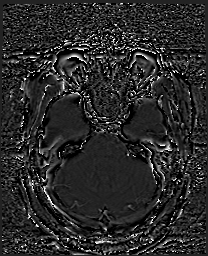
[im 36/54]
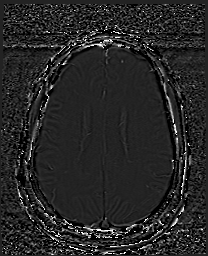
[im 54/54]
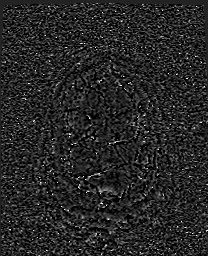

[Series 14: swi_images · axial · 3.0mm · 0.90mm/px · z∈[-100,+64]mm · 4 of 56 slices shown]
[im 1/56]
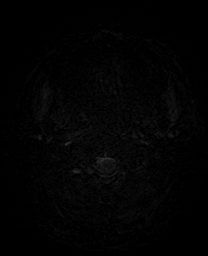
[im 19/56]
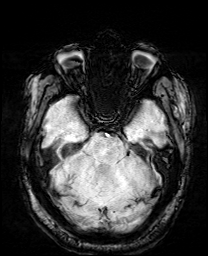
[im 37/56]
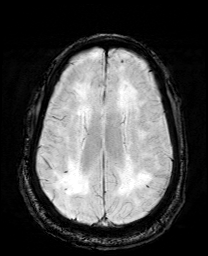
[im 56/56]
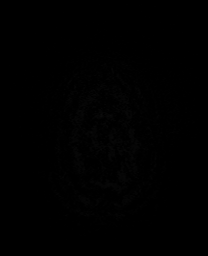

[Series 15: mip_images(sw) · axial · 24.0mm · 0.90mm/px · z∈[-89,+54]mm · 4 of 49 slices shown]
[im 1/49]
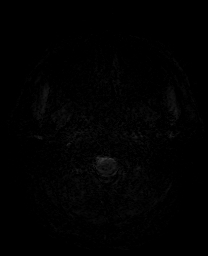
[im 17/49]
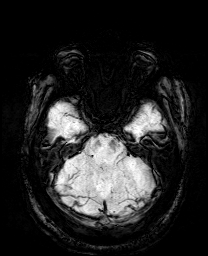
[im 33/49]
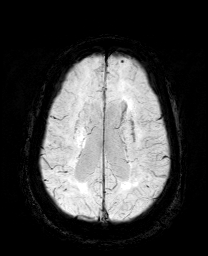
[im 49/49]
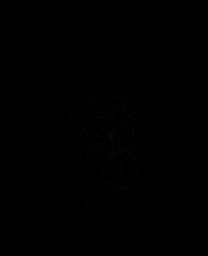

[Series 17: T2 · coronal · 5.0mm · 0.34mm/px · 2 of 32 slices shown (2 of 2)]
[im 1/32]
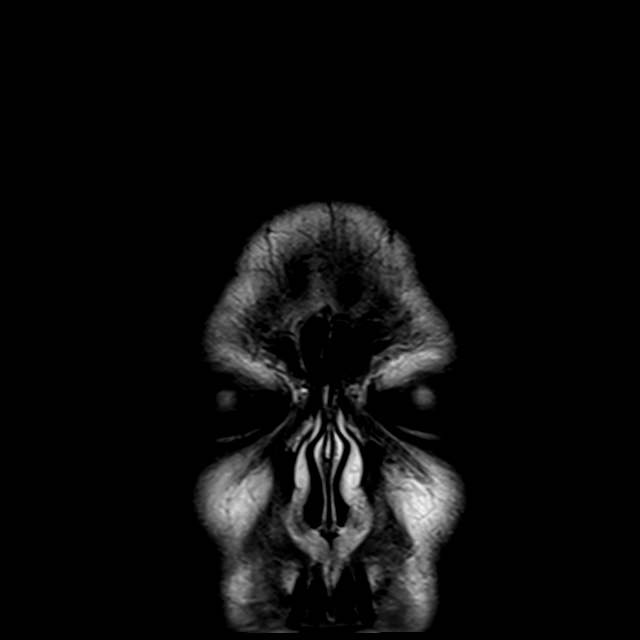
[im 32/32]
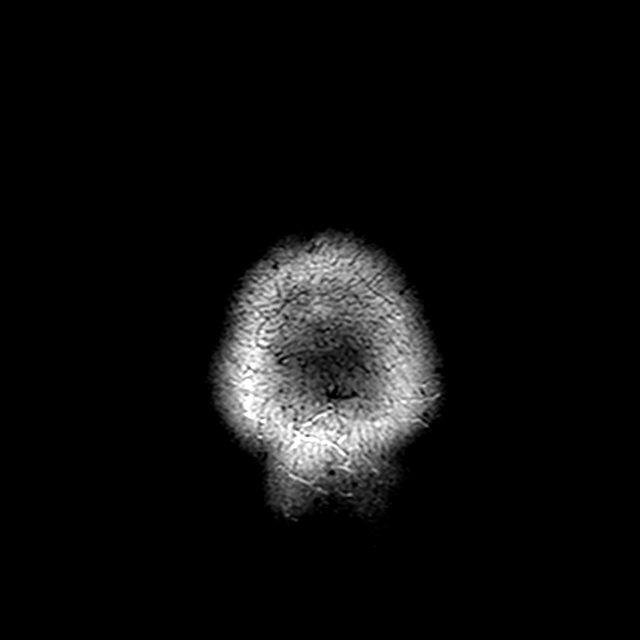

[44 of 48 positions shown; findings below may reference images not displayed]

FINDINGS: Brain: Generalized age-related cerebral volume loss. Extensive
patchy and confluent T2/FLAIR hyperintensity throughout the
periventricular and deep white matter both cerebral hemispheres as
well as the pons, consistent with advanced chronic microvascular
ischemic disease. Multiple remote lacunar infarcts noted about the
bilateral basal ganglia, thalami, and pons.

Recently identified ischemic infarct involving the right basal
ganglia appears mildly expanded and worsened as compared to
previous, measuring slightly increased in size with persistent
fairly pronounced restricted diffusion. No associated hemorrhage or
significant regional mass effect. Mild diffusion abnormality
extending inferiorly into the right cerebral peduncle and midbrain
likely reflects a degree of wallerian degeneration.

No other evidence for new or acute ischemic infarct. Gray-white
matter differentiation otherwise maintained. No other areas of
encephalomalacia to suggest chronic cortical infarction. No acute
intracranial hemorrhage. Few scattered chronic micro hemorrhages
noted, likely hypertensive in nature.

No mass lesion, midline shift or mass effect. No hydrocephalus or
extra-axial fluid collection. No made of a partially empty sella.
Midline structures intact.

Vascular: Major intracranial vascular flow voids are maintained.

Skull and upper cervical spine: Craniocervical junction within
normal limits. Bone marrow signal intensity normal. No scalp soft
tissue abnormality.

Sinuses/Orbits: Globes and orbital soft tissues within normal
limits. Paranasal sinuses are largely clear. Trace right mastoid
effusion noted, of doubtful significance. Inner ear structures
grossly normal.

Other: None.
IMPRESSION: 1. Slight interval expansion of the recently identified right basal
ganglia ischemic infarct. No associated hemorrhage or significant
regional mass effect.
2. No other acute intracranial abnormality.
3. Underlying advanced chronic microvascular ischemic disease with
multiple remote lacunar infarcts involving the deep gray nuclei and
brainstem.

## 2020-12-23 MED ORDER — MORPHINE SULFATE (PF) 2 MG/ML IV SOLN: 2.0000 mg | INTRAVENOUS | Status: DC | PRN

## 2020-12-23 MED ORDER — HEPARIN SODIUM (PORCINE) 5000 UNIT/ML IJ SOLN
5000.0000 [IU] | Freq: Three times a day (TID) | INTRAMUSCULAR | Status: DC
  Administered 2020-12-23 (×3): 5000 [IU] via SUBCUTANEOUS
  Filled 2020-12-23 (×3): qty 1

## 2020-12-23 MED ORDER — CLOPIDOGREL BISULFATE 75 MG PO TABS
75.0000 mg | ORAL_TABLET | Freq: Every day | ORAL | Status: DC
  Administered 2020-12-23 (×2): 75 mg via ORAL
  Filled 2020-12-23: qty 1

## 2020-12-23 MED ORDER — ATORVASTATIN CALCIUM 40 MG PO TABS
40.0000 mg | ORAL_TABLET | Freq: Every day | ORAL | Status: DC
  Administered 2020-12-23: 40 mg via ORAL
  Filled 2020-12-23: qty 1

## 2020-12-23 MED ORDER — HYDRALAZINE HCL 25 MG PO TABS: 25.0000 mg | ORAL_TABLET | Freq: Three times a day (TID) | ORAL | 0 refills | Status: AC

## 2020-12-23 MED ORDER — ASPIRIN 300 MG RE SUPP
300.0000 mg | Freq: Every day | RECTAL | Status: DC
  Administered 2020-12-23: 300 mg via RECTAL
  Filled 2020-12-23: qty 1

## 2020-12-23 MED ORDER — OXYCODONE HCL 5 MG PO TABS: 5.0000 mg | ORAL_TABLET | ORAL | Status: DC | PRN

## 2020-12-23 MED ORDER — HEPARIN SODIUM (PORCINE) 5000 UNIT/ML IJ SOLN: 5000.0000 [IU] | Freq: Three times a day (TID) | INTRAMUSCULAR | Status: DC

## 2020-12-23 MED ORDER — ONDANSETRON HCL 4 MG/2ML IJ SOLN: 4.0000 mg | Freq: Four times a day (QID) | INTRAMUSCULAR | Status: DC | PRN

## 2020-12-23 MED ORDER — METOPROLOL TARTRATE 5 MG/5ML IV SOLN
5.0000 mg | Freq: Four times a day (QID) | INTRAVENOUS | Status: DC | PRN
  Administered 2020-12-23: 5 mg via INTRAVENOUS
  Filled 2020-12-23: qty 5

## 2020-12-23 MED ORDER — HYDRALAZINE HCL 20 MG/ML IJ SOLN
10.0000 mg | Freq: Four times a day (QID) | INTRAMUSCULAR | Status: DC
  Administered 2020-12-23 (×3): 10 mg via INTRAVENOUS
  Filled 2020-12-23 (×3): qty 1

## 2020-12-23 MED ORDER — ONDANSETRON HCL 4 MG PO TABS: 4.0000 mg | ORAL_TABLET | Freq: Four times a day (QID) | ORAL | Status: DC | PRN

## 2020-12-23 MED ORDER — ACETAMINOPHEN 325 MG PO TABS: 650.0000 mg | ORAL_TABLET | Freq: Four times a day (QID) | ORAL | Status: DC | PRN

## 2020-12-23 MED ORDER — SPIRONOLACTONE 25 MG PO TABS
50.0000 mg | ORAL_TABLET | Freq: Every day | ORAL | Status: DC
  Administered 2020-12-23: 50 mg via ORAL
  Filled 2020-12-23: qty 2

## 2020-12-23 MED ORDER — ACETAMINOPHEN 650 MG RE SUPP: 650.0000 mg | Freq: Four times a day (QID) | RECTAL | Status: DC | PRN

## 2020-12-23 MED ORDER — NEBIVOLOL HCL 5 MG PO TABS: 5.0000 mg | ORAL_TABLET | Freq: Every day | ORAL | Status: DC

## 2020-12-23 MED ORDER — AMLODIPINE BESYLATE 10 MG PO TABS
10.0000 mg | ORAL_TABLET | Freq: Every day | ORAL | Status: DC
  Administered 2020-12-23: 10 mg via ORAL
  Filled 2020-12-23: qty 2

## 2020-12-23 NOTE — Evaluation (Signed)
Clinical/Bedside Swallow Evaluation Patient Details  Name: Gary Mayer MRN: [id number] Date of Birth: 1956-05-03  Today's Date: 12/23/2020 Time: SLP Start Time (ACUTE ONLY): 1009 SLP Stop Time (ACUTE ONLY): 1035 SLP Time Calculation (min) (ACUTE ONLY): 26 min  Past Medical History:  Past Medical History:  Diagnosis Date  . CKD (chronic kidney disease) stage 3, GFR 30-59 ml/min (HCC)   . Diastolic CHF (HCC)   . Hypertension   . Kidney stone    Past Surgical History: History reviewed. No pertinent surgical history. HPI:  Gary Mayer is a 65 y.o. male seen in ed with complaints of Elevated bp and headache at home.  Patient was recently admitted for an acute ischemic stroke and discharged on amlodipine Aldactone regimen.  Patient has been taking his medications at home and when he went to physical therapy later today his pressure was noted to be extremely elevated and was told to come to the emergency room.  Patient denies any numbness weakness paresthesia slurred speech gait difficulty any neurological symptoms.  He does have some residual facial droop and difficulty swallowing since his earlier stroke.  Patient currently lives with sister and states that patient is compliant.  MRI of head revealed slight stroke extension of right basal ganglia.   Assessment / Plan / Recommendation Clinical Impression  Pt presents with a mild oral and suspected component of pharyngeal dysphagia. He notes swallow difficulty onset was a few days after recent discharge. Note slight extension of right basal ganglia stroke this admission. Pt states he has overt coughing daily with all meals that have worried himself and family members. Pt with intermittent wet vocal quality, delayed overt cough with thins, and frequent throat clearing with all POs (ice chip, thin, nectar thick, honey thick, puree, and solids). Recommend proceed with instrumental assessment to guide diet recommendations and plan of care. MBSS  tentativley planned 1130 this am.  SLP Visit Diagnosis: Dysphagia, oral phase (R13.11)    Aspiration Risk  Mild aspiration risk;Moderate aspiration risk    Diet Recommendation   NPO, pending results of MBSS this date  Medication Administration: Whole meds with puree    Other  Recommendations Oral Care Recommendations: Oral care QID   Follow up Recommendations Home health SLP;Outpatient SLP      Frequency and Duration min 2x/week  2 weeks       Prognosis Prognosis for Safe Diet Advancement: Good Barriers to Reach Goals: Cognitive deficits;Time post onset      Swallow Study   General Date of Onset: 12/22/20 HPI: Gary Mayer is a 65 y.o. male seen in ed with complaints of Elevated bp and headache at home.  Patient was recently admitted for an acute ischemic stroke and discharged on amlodipine Aldactone regimen.  Patient has been taking his medications at home and when he went to physical therapy later today his pressure was noted to be extremely elevated and was told to come to the emergency room.  Patient denies any numbness weakness paresthesia slurred speech gait difficulty any neurological symptoms.  He does have some residual facial droop and difficulty swallowing since his earlier stroke.  Patient currently lives with sister and states that patient is compliant.  MRI of head revealed slight stroke extension of right basal ganglia. Type of Study: Bedside Swallow Evaluation Previous Swallow Assessment: passed Yale swallow protocol on previous admission Diet Prior to this Study: NPO Temperature Spikes Noted: No Respiratory Status: Room air History of Recent Intubation: No Behavior/Cognition: Alert;Cooperative;Pleasant mood Oral Cavity Assessment: Within Functional  Limits Oral Care Completed by SLP: Recent completion by staff Oral Cavity - Dentition: Adequate natural dentition Vision: Functional for self-feeding Self-Feeding Abilities: Able to feed self Patient Positioning:  Upright in bed Baseline Vocal Quality: Normal Volitional Cough: Strong Volitional Swallow: Able to elicit    Oral/Motor/Sensory Function Overall Oral Motor/Sensory Function: Mild impairment Facial ROM: Reduced left;Suspected CN VII (facial) dysfunction Facial Symmetry: Abnormal symmetry left;Suspected CN VII (facial) dysfunction Facial Strength: Reduced left Lingual ROM: Within Functional Limits Lingual Symmetry: Within Functional Limits Lingual Strength: Reduced Velum: Within Functional Limits Mandible: Within Functional Limits   Ice Chips Ice chips: Impaired Presentation: Spoon Oral Phase Functional Implications: Prolonged oral transit Pharyngeal Phase Impairments: Suspected delayed Swallow;Multiple swallows;Throat Clearing - Delayed   Thin Liquid Thin Liquid: Impaired Presentation: Cup;Straw Oral Phase Functional Implications: Prolonged oral transit Pharyngeal  Phase Impairments: Suspected delayed Swallow;Multiple swallows;Wet Vocal Quality;Cough - Delayed;Throat Clearing - Delayed    Nectar Thick Nectar Thick Liquid: Impaired Presentation: Cup Oral phase functional implications: Prolonged oral transit Pharyngeal Phase Impairments: Suspected delayed Swallow;Multiple swallows;Throat Clearing - Delayed;Wet Vocal Quality   Honey Thick Honey Thick Liquid: Impaired Presentation: Cup Oral Phase Functional Implications: Prolonged oral transit Pharyngeal Phase Impairments: Suspected delayed Swallow;Multiple swallows;Throat Clearing - Delayed   Puree Puree: Impaired Presentation: Self Fed Oral Phase Functional Implications: Prolonged oral transit Pharyngeal Phase Impairments: Suspected delayed Swallow;Multiple swallows;Wet Vocal Quality   Solid     Solid: Impaired Presentation: Self Fed Oral Phase Impairments: Reduced lingual movement/coordination;Impaired mastication Oral Phase Functional Implications: Left lateral sulci pocketing Pharyngeal Phase Impairments: Suspected delayed  Swallow;Multiple swallows     Ambrie Carte H. MA, CCC-SLP Acute Rehabilitation Services   12/23/2020,10:54 AM

## 2020-12-23 NOTE — Progress Notes (Signed)
PT expressed concerns about discharging home. Pt requested to speak with provider. MD notified. Rn will continue to monitor.

## 2020-12-23 NOTE — Progress Notes (Signed)
Pt earlier discharged, waiting for his ride, his sister came and picked up pt at 1950, pt taken down on a wheelchair, was however reassured. Obasogie-Asidi, Maame Dack Efe

## 2020-12-23 NOTE — Progress Notes (Signed)
Pt continues to express discomfort being discharged. RN expressed concerns to provider. Provider continued with discharge.

## 2020-12-23 NOTE — Progress Notes (Signed)
Modified Barium Swallow Progress Note  Patient Details  Name: Gary Mayer MRN: [id number] Date of Birth: 08/03/1956  Today's Date: 12/23/2020  Modified Barium Swallow completed.  Full report located under Chart Review in the Imaging Section.  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 Oral deficits include delayed oral transit, reduced bolus cohesion with solids, and intermittent left sided pocketing (he states he notices a "big ball of food" across meals). Second swallows during MBSS cleared oral cavity in addition to left lingual sweep. Pt with trace left sided anterior spillage with nectar thick liquids without awarness. Pharyngeal deficits included reduced timing and efficiency of laryngeal vestibule closure, decreased timely epiglottic inversion with swallow delays to the pyriform sinuses with all liquids. This allowed for intermittent laryngeal penetration with all liquids (thin, nectar thick, and honey thick) and episodic silent aspiration of thin liquids via straw use. Pts cued strong cough was effective to expell penetrates from laryngeal vestibule. Trace vallecular residuals noted from oral spillage, clearing with second volitional swallow. Recommend dysphagia 3 (mechanical soft) and thin liquids (no straws) and meds whole in puree with strict adherence to safe swallow strategies. These include small sips and bites, swallow x2 with all bites and sips, left lingual sweep, and intermittent throat clear. Precuations given in written form. Pt verbalized understanding. SLP will follow during this admission. Recommend outpatient SLP servcies for dysphagia and previously identified dysarthria and cognitive deficits.   Swallow Evaluation Recommendations       SLP Diet Recommendations: Thin liquid;Dysphagia 3 (Mech soft) solids   Liquid Administration via: Cup;No straw   Medication  Administration: Whole meds with puree   Supervision: Patient able to self feed;Full supervision/cueing for compensatory strategies   Compensations: Small sips/bites;Slow rate;Minimize environmental distractions;Lingual sweep for clearance of pocketing;Multiple dry swallows after each bite/sip;Clear throat intermittently   Postural Changes: Seated upright at 90 degrees;Remain semi-upright after after feeds/meals (Comment)   Oral Care Recommendations: Oral care BID        Arihana Ambrocio E Zyria Fiscus MA, CCC-SLP 12/23/2020,12:09 PM

## 2020-12-23 NOTE — ED Notes (Signed)
Unable to reach admitting team for consultation regarding pt hypertension. EDP Bero made aware of pt status.

## 2020-12-23 NOTE — ED Notes (Signed)
Pt ambulated to bathroom to have BM. Pt escorted back to room and placed back on monitor. Call bell within reach.

## 2020-12-23 NOTE — Discharge Summary (Signed)
Physician Discharge Summary  Gary HammanRobert Mayer ZOX:[id number] DOB: 1956/06/26 DOA: 12/22/2020  PCP: Pcp, No  Admit date: 12/22/2020 Discharge date: 12/23/2020  Admitted From: Home Disposition: Home  Recommendations for Outpatient Follow-up:  1. Follow up with PCP in 1-2 weeks 2. Follow-up with neurology 3. Please obtain BMP/CBC in one week 4. Please follow up on the following pending results: None  Home Health: Yes Equipment/Devices: None Discharge Condition: Stable CODE STATUS: Full Diet recommendation: Heart Healthy / Dysphagia 3  Brief/Interim Summary:  Gary Mayer is a 65 y.o. male seen in ed with complaints of Elevated bp and headache at home.  Patient was recently admitted for an acute ischemic stroke and discharged on amlodipine Aldactone regimen.  Patient has been taking his medications at home and when he went to physical therapy later today his pressure was noted to be extremely elevated and was told to come to the emergency room.  Patient denies any numbness weakness paresthesia slurred speech gait difficulty any neurological symptoms.  He does have some residual facial droop and difficulty swallowing since his earlier stroke.   Patient's blood pressure was elevated with systolic above 200 on arrival.  CT head was negative for any acute findings, MRI brain with mild extension of prior stroke with no sign of any hemorrhage.  No new neurologic deficits.  Continue to have some dysarthria and dysphagia since prior stroke.  Because of his dysphagia he was evaluated again by our swallow team and also had barium swallow studies, they recommending dysphagia 3 diet with thin liquids and no straws, he has to sit upright to prevent any aspiration.  Patient will need outpatient/home health speech and swallow therapy.  Blood pressure improved with addition of hydralazine to home regimen.  He was discharged home on amlodipine, Aldactone and low-dose hydralazine 25 mg 3 times a day and needs a  close follow-up with PCP for further titration of his dose.  He will continue with aspirin and Plavix at this time and follow-up with his neurologist for further recommendations and the duration of DAPT.  Will continue with rest of his home medications and follow-up with his providers.  Discharge Diagnoses:  Principal Problem:   Hypertensive urgency Active Problems:   Acute ischemic stroke (HCC)   CKD (chronic kidney disease) stage 3, GFR 30-59 ml/min (HCC)   Dysphagia   Discharge Instructions  Discharge Instructions    Call MD for:  difficulty breathing, headache or visual disturbances   Complete by: As directed    Call MD for:  persistant dizziness or light-headedness   Complete by: As directed    Diet - low sodium heart healthy   Complete by: As directed    Discharge instructions   Complete by: As directed    It was pleasure taking care of you. We are adding another medicine card hydralazine for your blood pressure, please take it as directed 3 times a day, preferably 8 hours apart. Continue taking rest of your medications and follow-up with your primary care provider for further titration of the dose. Keep yourself well-hydrated. Continue taking your diet as directed by our swallow team, we also ordered some home health speech and swallow therapy. Follow-up with your neurologist as an outpatient for further recommendations about your stroke management.   Increase activity slowly   Complete by: As directed      Allergies as of 12/23/2020   No Known Allergies     Medication List    TAKE these medications   amLODipine 10 MG  tablet Commonly known as: NORVASC Take 1 tablet (10 mg total) by mouth daily.   Aspirin Low Dose 81 MG EC tablet Generic drug: aspirin Take 1 tablet (81 mg total) by mouth daily. Swallow whole.   atorvastatin 40 MG tablet Commonly known as: LIPITOR Take 1 tablet (40 mg total) by mouth daily.   clopidogrel 75 MG tablet Commonly known as:  PLAVIX Take 1 tablet (75 mg total) by mouth daily.   hydrALAZINE 25 MG tablet Commonly known as: APRESOLINE Take 1 tablet (25 mg total) by mouth 3 (three) times daily.   lidocaine 5 % Commonly known as: LIDODERM Place 1 patch onto the skin daily as needed (pain).   LUTEIN PO Take 1 capsule by mouth daily.   One-A-Day Mens 50+ Tabs Take 1 tablet by mouth daily.   OVER THE COUNTER MEDICATION Take 2 tablets by mouth daily. Kyolic cardiovascular vitamin garlic based   spironolactone 50 MG tablet Commonly known as: ALDACTONE Take 1 tablet (50 mg total) by mouth daily.       No Known Allergies  Consultations:  None  Procedures/Studies: CT Head Wo Contrast  Result Date: 12/22/2020 CLINICAL DATA:  Neuro deficit, acute, stroke suspected Mental status change, unknown cause Patient reports hypertension.  Recent stroke. EXAM: CT HEAD WITHOUT CONTRAST TECHNIQUE: Contiguous axial images were obtained from the base of the skull through the vertex without intravenous contrast. COMPARISON:  Head CT and brain MRI 12/14/2020 FINDINGS: Brain: No acute hemorrhage. Expected evolution of recent right caudate tail infarct from prior MRI. Stable advanced chronic small vessel ischemic change in multiple remote lacunar infarcts. No evidence of new ischemia. No hydrocephalus, midline shift, or mass effect. No subdural or extra-axial collection. Vascular: There is no hyperdense vessel. Skull: No fracture or focal lesion. Sinuses/Orbits: Paranasal sinuses and mastoid air cells are clear. The visualized orbits are unremarkable. Other: None. IMPRESSION: 1. Expected evolution of recent right caudate tail infarct from prior MRI. No hemorrhage. 2. Stable advanced chronic small vessel ischemic change and multiple remote lacunar infarcts. Electronically Signed   By: Narda Rutherford M.D.   On: 12/22/2020 20:58   CT Head Wo Contrast  Result Date: 12/14/2020 CLINICAL DATA:  Weakness hypertension EXAM: CT HEAD  WITHOUT CONTRAST TECHNIQUE: Contiguous axial images were obtained from the base of the skull through the vertex without intravenous contrast. COMPARISON:  None. FINDINGS: Brain: No acute territorial infarction, hemorrhage or intracranial mass. Advanced hypodensity in the white matter consistent with chronic small vessel ischemic change. Age indeterminate lacunar infarcts within the left basal ganglia/white matter, right thalamus and pons. Nonenlarged ventricles Vascular: No hyperdense vessels.  No unexpected calcification. Skull: Normal. Negative for fracture or focal lesion. Sinuses/Orbits: No acute finding. Other: None IMPRESSION: 1. Advanced chronic small vessel ischemic change of white matter. Age indeterminate lacunar infarcts within the left basal ganglia, left white matter, right thalamus and pons. 2. Negative for hemorrhage or intracranial mass Electronically Signed   By: Jasmine Pang M.D.   On: 12/14/2020 19:56   MR ANGIO HEAD WO CONTRAST  Result Date: 12/14/2020 CLINICAL DATA:  Stroke-like symptoms EXAM: MRA HEAD WITHOUT CONTRAST TECHNIQUE: Angiographic images of the Circle of Willis were obtained using MRA technique without intravenous contrast. COMPARISON:  None. FINDINGS: POSTERIOR CIRCULATION: --Basilar artery: Normal. --Superior cerebellar arteries: Normal. --Posterior cerebral arteries: Normal. ANTERIOR CIRCULATION: --Intracranial internal carotid arteries: Normal. --Anterior cerebral arteries (ACA): Normal. Hypoplastic right A1 segment, normal variant. --Middle cerebral arteries (MCA): Normal. IMPRESSION: Normal intracranial MRA. Electronically Signed   By:  Deatra Robinson M.D.   On: 12/14/2020 22:23   MR BRAIN WO CONTRAST  Result Date: 12/23/2020 CLINICAL DATA:  Initial evaluation for acute delirium. EXAM: MRI HEAD WITHOUT CONTRAST TECHNIQUE: Multiplanar, multiecho pulse sequences of the brain and surrounding structures were obtained without intravenous contrast. COMPARISON:  Prior CT from  12/22/2020 as well as recent MRI from 01/10/2021. FINDINGS: Brain: Generalized age-related cerebral volume loss. Extensive patchy and confluent T2/FLAIR hyperintensity throughout the periventricular and deep white matter both cerebral hemispheres as well as the pons, consistent with advanced chronic microvascular ischemic disease. Multiple remote lacunar infarcts noted about the bilateral basal ganglia, thalami, and pons. Recently identified ischemic infarct involving the right basal ganglia appears mildly expanded and worsened as compared to previous, measuring slightly increased in size with persistent fairly pronounced restricted diffusion. No associated hemorrhage or significant regional mass effect. Mild diffusion abnormality extending inferiorly into the right cerebral peduncle and midbrain likely reflects a degree of wallerian degeneration. No other evidence for new or acute ischemic infarct. Gray-white matter differentiation otherwise maintained. No other areas of encephalomalacia to suggest chronic cortical infarction. No acute intracranial hemorrhage. Few scattered chronic micro hemorrhages noted, likely hypertensive in nature. No mass lesion, midline shift or mass effect. No hydrocephalus or extra-axial fluid collection. No made of a partially empty sella. Midline structures intact. Vascular: Major intracranial vascular flow voids are maintained. Skull and upper cervical spine: Craniocervical junction within normal limits. Bone marrow signal intensity normal. No scalp soft tissue abnormality. Sinuses/Orbits: Globes and orbital soft tissues within normal limits. Paranasal sinuses are largely clear. Trace right mastoid effusion noted, of doubtful significance. Inner ear structures grossly normal. Other: None. IMPRESSION: 1. Slight interval expansion of the recently identified right basal ganglia ischemic infarct. No associated hemorrhage or significant regional mass effect. 2. No other acute intracranial  abnormality. 3. Underlying advanced chronic microvascular ischemic disease with multiple remote lacunar infarcts involving the deep gray nuclei and brainstem. Electronically Signed   By: Rise Mu M.D.   On: 12/23/2020 02:12   MR Brain Wo Contrast (neuro protocol)  Result Date: 12/14/2020 CLINICAL DATA:  Stroke-like symptoms EXAM: MRI HEAD WITHOUT CONTRAST TECHNIQUE: Multiplanar, multiecho pulse sequences of the brain and surrounding structures were obtained without intravenous contrast. COMPARISON:  None. FINDINGS: Brain: Small acute infarct of the right caudate tail. No acute or chronic hemorrhage. Confluent hyperintense T2-weighted white matter signal. Generalized volume loss without a clear lobar predilection. There are multiple old small vessel infarcts of the brainstem and deep gray nuclei. The midline structures are normal. Vascular: Major flow voids are preserved. Skull and upper cervical spine: Normal calvarium and skull base. Visualized upper cervical spine and soft tissues are normal. Sinuses/Orbits:No paranasal sinus fluid levels or advanced mucosal thickening. No mastoid or middle ear effusion. Normal orbits. IMPRESSION: 1. Small acute infarct of the right caudate tail. No hemorrhage or mass effect. 2. Multiple old small vessel infarcts of the brainstem and deep gray nuclei. Electronically Signed   By: Deatra Robinson M.D.   On: 12/14/2020 22:10   DG Chest Portable 1 View  Result Date: 12/14/2020 CLINICAL DATA:  Altered mental status EXAM: PORTABLE CHEST 1 VIEW COMPARISON:  None. FINDINGS: The heart size and mediastinal contours are within normal limits. Both lungs are clear. The visualized skeletal structures are unremarkable. IMPRESSION: No active disease. Electronically Signed   By: Jasmine Pang M.D.   On: 12/14/2020 19:56   DG Swallowing Func-Speech Pathology  Result Date: 12/23/2020 Objective Swallowing Evaluation: Type of Study:  MBS-Modified Barium Swallow Study  Patient  Details Name: Isac Lincks MRN: [id number] Date of Birth: 18-Jan-1956 Today's Date: 12/23/2020 Time: SLP Start Time (ACUTE ONLY): 1120 -SLP Stop Time (ACUTE ONLY): 1135 SLP Time Calculation (min) (ACUTE ONLY): 15 min Past Medical History: Past Medical History: Diagnosis Date . CKD (chronic kidney disease) stage 3, GFR 30-59 ml/min (HCC)  . Diastolic CHF (HCC)  . Hypertension  . Kidney stone  Past Surgical History: No past surgical history on file. HPI: Shimon Trowbridge is a 65 y.o. male seen in ed with complaints of Elevated bp and headache at home.  Patient was recently admitted for an acute ischemic stroke and discharged on amlodipine Aldactone regimen.  Patient has been taking his medications at home and when he went to physical therapy later today his pressure was noted to be extremely elevated and was told to come to the emergency room.  Patient denies any numbness weakness paresthesia slurred speech gait difficulty any neurological symptoms.  He does have some residual facial droop and difficulty swallowing since his earlier stroke.  Patient currently lives with sister and states that patient is compliant.  MRI of head revealed slight stroke extension of right basal ganglia.  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 Oral deficits include delayed oral transit, reduced bolus cohesion with solids, and intermittent left sided pocketing (he states he notices a "big ball of food" across meals). Second swallows during MBSS cleared oral cavity in addition to left lingual sweep. Pt with trace left sided anterior spillage with nectar thick liquids without awarness. Pharyngeal deficits included reduced timing and efficiency of laryngeal vestibule closure, decreased timely epiglottic inversion with swallow delays to the pyriform  sinuses with all liquids. This allowed for intermittent laryngeal penetration with all liquids (thin, nectar thick, and honey thick) and episodic silent aspiration of thin liquids via straw use. Pts cued strong cough was effective to expell penetrates from laryngeal vestibule. Trace vallecular residuals noted from oral spillage, clearing with second volitional swallow. Recommend dysphagia 3 (mechanical soft) and thin liquids (no straws) and meds whole in puree with strict adherence to safe swallow strategies. These include small sips and bites, swallow x2 with all bites and sips, left lingual sweep, and intermittent throat clear. Precuations given in written form. Pt verbalized understanding. SLP will follow during this admission. Recommend outpatient SLP servcies for dysphagia and previously identified dysarthria and cognitive deficits. SLP Visit Diagnosis Dysphagia, oropharyngeal phase (R13.12) Attention and concentration deficit following -- Frontal lobe and executive function deficit following -- Impact on safety and function Mild aspiration risk;Moderate aspiration risk   CHL IP TREATMENT RECOMMENDATION 12/23/2020 Treatment Recommendations Therapy as outlined in treatment plan below   Prognosis 12/23/2020 Prognosis for Safe Diet Advancement Good Barriers to Reach Goals Cognitive deficits Barriers/Prognosis Comment -- CHL IP DIET RECOMMENDATION 12/23/2020 SLP Diet Recommendations Thin liquid;Dysphagia 3 (Mech soft) solids Liquid Administration via Cup;No straw Medication Administration Whole meds with puree Compensations Small sips/bites;Slow rate;Minimize environmental distractions;Lingual sweep for clearance of pocketing;Multiple dry swallows after each bite/sip;Clear throat intermittently Postural Changes Seated upright at 90 degrees;Remain semi-upright after after feeds/meals (Comment)   CHL IP OTHER RECOMMENDATIONS 12/23/2020 Recommended Consults -- Oral Care Recommendations Oral care BID Other Recommendations  --   CHL IP FOLLOW UP RECOMMENDATIONS 12/23/2020 Follow up Recommendations Outpatient SLP   CHL IP FREQUENCY AND DURATION 12/23/2020 Speech Therapy  Frequency (ACUTE ONLY) min 2x/week Treatment Duration 1 week;2 weeks      CHL IP ORAL PHASE 12/23/2020 Oral Phase Impaired Oral - Pudding Teaspoon -- Oral - Pudding Cup -- Oral - Honey Teaspoon -- Oral - Honey Cup Left anterior bolus loss;Delayed oral transit;Lingual/palatal residue Oral - Nectar Teaspoon -- Oral - Nectar Cup Left anterior bolus loss;Delayed oral transit;Lingual/palatal residue Oral - Nectar Straw -- Oral - Thin Teaspoon -- Oral - Thin Cup Delayed oral transit Oral - Thin Straw Lingual/palatal residue Oral - Puree Lingual/palatal residue Oral - Mech Soft Lingual/palatal residue;Piecemeal swallowing;Decreased bolus cohesion Oral - Regular -- Oral - Multi-Consistency -- Oral - Pill -- Oral Phase - Comment --  CHL IP PHARYNGEAL PHASE 12/23/2020 Pharyngeal Phase Impaired Pharyngeal- Pudding Teaspoon -- Pharyngeal -- Pharyngeal- Pudding Cup -- Pharyngeal -- Pharyngeal- Honey Teaspoon -- Pharyngeal -- Pharyngeal- Honey Cup Reduced epiglottic inversion;Penetration/Aspiration before swallow;Penetration/Aspiration during swallow;Pharyngeal residue - valleculae;Pharyngeal residue - pyriform;Delayed swallow initiation-pyriform sinuses Pharyngeal Material enters airway, remains ABOVE vocal cords and not ejected out Pharyngeal- Nectar Teaspoon -- Pharyngeal -- Pharyngeal- Nectar Cup Reduced epiglottic inversion;Reduced airway/laryngeal closure;Delayed swallow initiation-pyriform sinuses;Penetration/Aspiration before swallow;Penetration/Aspiration during swallow Pharyngeal Material enters airway, remains ABOVE vocal cords and not ejected out;Material does not enter airway Pharyngeal- Nectar Straw -- Pharyngeal -- Pharyngeal- Thin Teaspoon -- Pharyngeal -- Pharyngeal- Thin Cup Penetration/Aspiration before swallow;Penetration/Aspiration during swallow;Reduced  airway/laryngeal closure;Reduced epiglottic inversion Pharyngeal Material does not enter airway;Material enters airway, remains ABOVE vocal cords and not ejected out;Material enters airway, CONTACTS cords and then ejected out Pharyngeal- Thin Straw Penetration/Aspiration before swallow;Penetration/Aspiration during swallow;Reduced epiglottic inversion;Delayed swallow initiation-pyriform sinuses Pharyngeal Material enters airway, passes BELOW cords without attempt by patient to eject out (silent aspiration) Pharyngeal- Puree Delayed swallow initiation-vallecula Pharyngeal -- Pharyngeal- Mechanical Soft Delayed swallow initiation-vallecula Pharyngeal -- Pharyngeal- Regular -- Pharyngeal -- Pharyngeal- Multi-consistency -- Pharyngeal -- Pharyngeal- Pill -- Pharyngeal -- Pharyngeal Comment --  CHL IP CERVICAL ESOPHAGEAL PHASE 12/23/2020 Cervical Esophageal Phase WFL Pudding Teaspoon -- Pudding Cup -- Honey Teaspoon -- Honey Cup -- Nectar Teaspoon -- Nectar Cup -- Nectar Straw -- Thin Teaspoon -- Thin Cup -- Thin Straw -- Puree -- Mechanical Soft -- Regular -- Multi-consistency -- Pill -- Cervical Esophageal Comment -- Chelsea E Hartness MA, CCC-SLP 12/23/2020, 12:08 PM              ECHOCARDIOGRAM COMPLETE  Result Date: 12/15/2020    ECHOCARDIOGRAM REPORT   Patient Name:   JESON CAMACHO Date of Exam: 12/15/2020 Medical Rec #:  [id number]     Height: Accession #:    [id number]    Weight: Date of Birth:  06-15-1956    BSA: Patient Age:    64 years      BP:           208/123 mmHg Patient Gender: M             HR:           80 bpm. Exam Location:  Inpatient Procedure: 2D Echo, Cardiac Doppler and Color Doppler Indications:    CVA  History:        Patient has no prior history of Echocardiogram examinations.                 CHF, Stroke; Risk Factors:Hypertension and CKD.  Sonographer:    Lavenia Atlas Referring Phys: [phone] JARED M GARDNER IMPRESSIONS  1. Left ventricular ejection fraction, by estimation, is 60 to 65%. The  left ventricle has normal function. The left ventricle has  no regional wall motion abnormalities. There is mild left ventricular hypertrophy. Left ventricular diastolic parameters were normal.  2. Right ventricular systolic function is normal. The right ventricular size is normal.  3. Left atrial size was mildly dilated.  4. The mitral valve is normal in structure. No evidence of mitral valve regurgitation. No evidence of mitral stenosis.  5. The aortic valve is tricuspid. There is mild calcification of the aortic valve. Aortic valve regurgitation is not visualized. Mild aortic valve sclerosis is present, with no evidence of aortic valve stenosis.  6. The inferior vena cava is normal in size with greater than 50% respiratory variability, suggesting right atrial pressure of 3 mmHg. FINDINGS  Left Ventricle: Left ventricular ejection fraction, by estimation, is 60 to 65%. The left ventricle has normal function. The left ventricle has no regional wall motion abnormalities. The left ventricular internal cavity size was normal in size. There is  mild left ventricular hypertrophy. Left ventricular diastolic parameters were normal. Right Ventricle: The right ventricular size is normal. No increase in right ventricular wall thickness. Right ventricular systolic function is normal. Left Atrium: Left atrial size was mildly dilated. Right Atrium: Right atrial size was normal in size. Pericardium: There is no evidence of pericardial effusion. Mitral Valve: The mitral valve is normal in structure. No evidence of mitral valve regurgitation. No evidence of mitral valve stenosis. Tricuspid Valve: The tricuspid valve is normal in structure. Tricuspid valve regurgitation is trivial. No evidence of tricuspid stenosis. Aortic Valve: The aortic valve is tricuspid. There is mild calcification of the aortic valve. Aortic valve regurgitation is not visualized. Mild aortic valve sclerosis is present, with no evidence of aortic valve  stenosis. Pulmonic Valve: The pulmonic valve was normal in structure. Pulmonic valve regurgitation is not visualized. No evidence of pulmonic stenosis. Aorta: The aortic root is normal in size and structure. Venous: The inferior vena cava is normal in size with greater than 50% respiratory variability, suggesting right atrial pressure of 3 mmHg. IAS/Shunts: No atrial level shunt detected by color flow Doppler.  LEFT VENTRICLE PLAX 2D LVIDd:         4.70 cm  Diastology LVIDs:         2.90 cm  LV e' medial:    5.11 cm/s LV PW:         1.40 cm  LV E/e' medial:  12.7 LV IVS:        1.20 cm  LV e' lateral:   7.18 cm/s LVOT diam:     2.30 cm  LV E/e' lateral: 9.1 LV SV:         59 LVOT Area:     4.15 cm  RIGHT VENTRICLE RV Basal diam:  2.60 cm RV S prime:     5.87 cm/s TAPSE (M-mode): 2.8 cm LEFT ATRIUM             RIGHT ATRIUM LA diam:        4.10 cm RA Area:     11.40 cm LA Vol (A2C):   35.2 ml RA Volume:   24.70 ml LA Vol (A4C):   42.9 ml LA Biplane Vol: 39.3 ml  AORTIC VALVE LVOT Vmax:   79.80 cm/s LVOT Vmean:  46.200 cm/s LVOT VTI:    0.142 m  AORTA Ao Root diam: 3.20 cm MITRAL VALVE MV Area (PHT): 4.39 cm    SHUNTS MV Decel Time: 173 msec    Systemic VTI:  0.14 m MV E velocity: 65.10 cm/s  Systemic Diam:  2.30 cm MV A velocity: 63.80 cm/s MV E/A ratio:  1.02 Charlton Haws MD Electronically signed by Charlton Haws MD Signature Date/Time: 12/15/2020/10:58:32 AM    Final    VAS US CAROTID (at [facility] and [facility] only)  Result Date: 12/16/2020 Carotid Arterial Duplex Study Patient Name:  WITTEN CERTAIN  Date of Exam:   12/15/2020 Medical Rec #: [id number]      Accession #:    [id number] Date of Birth: [birth year]     Patient Gender: M Patient Age:   64Y Exam Location:  [facility] Procedure:      VAS US CAROTID Referring Phys: [id number] Iles GARDNER --------------------------------------------------------------------------------  Indications:       CVA. Risk Factors:      Hypertension. Comparison Study:  no prior Performing  Technologist: Blanch Media RVS  Examination Guidelines: A complete evaluation includes B-mode imaging, spectral Doppler, color Doppler, and power Doppler as needed of all accessible portions of each vessel. Bilateral testing is considered an integral part of a complete examination. Limited examinations for reoccurring indications may be performed as noted.  Right Carotid Findings: +----------+--------+--------+--------+------------------+--------+           PSV cm/sEDV cm/sStenosisPlaque DescriptionComments +----------+--------+--------+--------+------------------+--------+ CCA Prox  109     22              heterogenous               +----------+--------+--------+--------+------------------+--------+ CCA Distal104     14              heterogenous               +----------+--------+--------+--------+------------------+--------+ ICA Prox  49      10      1-39%   heterogenous               +----------+--------+--------+--------+------------------+--------+ ICA Distal37      9                                          +----------+--------+--------+--------+------------------+--------+ ECA       150     22                                         +----------+--------+--------+--------+------------------+--------+ +----------+--------+-------+--------+-------------------+           PSV cm/sEDV cmsDescribeArm Pressure (mmHG) +----------+--------+-------+--------+-------------------+ [id number]                                         +----------+--------+-------+--------+-------------------+ +---------+--------+--+--------+--+---------+ VertebralPSV cm/s40EDV cm/s10Antegrade +---------+--------+--+--------+--+---------+  Left Carotid Findings: +----------+--------+--------+--------+------------------+--------+           PSV cm/sEDV cm/sStenosisPlaque DescriptionComments +----------+--------+--------+--------+------------------+--------+ CCA Prox  98       20              heterogenous               +----------+--------+--------+--------+------------------+--------+ CCA Distal101     36              heterogenous               +----------+--------+--------+--------+------------------+--------+ ICA Prox  78      28      1-39%   heterogenous               +----------+--------+--------+--------+------------------+--------+  ICA Distal83      20                                         +----------+--------+--------+--------+------------------+--------+ ECA       161     22                                         +----------+--------+--------+--------+------------------+--------+ +----------+--------+--------+--------+-------------------+           PSV cm/sEDV cm/sDescribeArm Pressure (mmHG) +----------+--------+--------+--------+-------------------+ [id number]                                          +----------+--------+--------+--------+-------------------+ +---------+--------+--+--------+--+---------+ VertebralPSV cm/s27EDV cm/s13Antegrade +---------+--------+--+--------+--+---------+   Summary: Right Carotid: Velocities in the right ICA are consistent with a 1-39% stenosis. Left Carotid: Velocities in the left ICA are consistent with a 1-39% stenosis. Vertebrals: Bilateral vertebral arteries demonstrate antegrade flow. *See table(s) above for measurements and observations.  Electronically signed by Delia Heady MD on 12/16/2020 at 8:15:12 AM.    Final      Subjective: Patient was seen and examined today.  No new complaints.  Wants to go home.  Continue to have some swallow difficulties, stating that he has to maneuver his head to make it swallow.  Per patient he will try his best to follow swallow team recommendations to prevent aspiration.  Continue to have this difficulty since his prior stroke, no new changes.  Denies any new weaknesses or deficits.  Discharge Exam: Vitals:   12/23/20 1030 12/23/20 1245  BP:  (!) 177/96 (!) 154/95  Pulse: 63 67  Resp: 16 18  Temp:    SpO2: 99% 99%   Vitals:   12/23/20 0837 12/23/20 1000 12/23/20 1030 12/23/20 1245  BP: (!) 184/104 (!) 166/86 (!) 177/96 (!) 154/95  Pulse: (!) 59 62 63 67  Resp: (!) Temp:      TempSrc:      SpO2: 99% 100% 99% 99%    General: Pt is alert, awake, not in acute distress, mild left facial droop. Cardiovascular: RRR, S1/S2 +, no rubs, no gallops Respiratory: CTA bilaterally, no wheezing, no rhonchi Abdominal: Soft, NT, ND, bowel sounds + Extremities: no edema, no cyanosis   The results of significant diagnostics from this hospitalization (including imaging, microbiology, ancillary and laboratory) are listed below for reference.    Microbiology: Recent Results (from the past 240 hour(s))  SARS CORONAVIRUS 2 (TAT 6-24 HRS) Nasopharyngeal Nasopharyngeal Swab     Status: None   Collection Time: 12/14/20  8:44 PM   Specimen: Nasopharyngeal Swab  Result Value Ref Range Status   SARS Coronavirus 2 NEGATIVE NEGATIVE Final    Comment: (NOTE) SARS-CoV-2 target nucleic acids are NOT DETECTED.  The SARS-CoV-2 RNA is generally detectable in upper and lower respiratory specimens during the acute phase of infection. Negative results do not preclude SARS-CoV-2 infection, do not rule out co-infections with other pathogens, and should not be used as the sole basis for treatment or other patient management decisions. Negative results must be combined with clinical observations, patient history, and epidemiological information. The expected result is Negative.  Fact Sheet for Patients: HairSlick.no  Fact Sheet for Healthcare  Providers: quierodirigir.com  This test is not yet approved or cleared by the [place] and  has been authorized for detection and/or diagnosis of SARS-CoV-2 by FDA under an Emergency Use Authorization (EUA). This EUA will remain  in  effect (meaning this test can be used) for the duration of the COVID-19 declaration under Se ction 564(b)(1) of the Act, 21 U.S.C. section 360bbb-3(b)(1), unless the authorization is terminated or revoked sooner.  Performed at [facility], [address], [place], [place] [address]   SARS CORONAVIRUS 2 (TAT 6-24 HRS) Nasopharyngeal Nasopharyngeal Swab     Status: None   Collection Time: 12/23/20  2:35 AM   Specimen: Nasopharyngeal Swab  Result Value Ref Range Status   SARS Coronavirus 2 NEGATIVE NEGATIVE Final    Comment: (NOTE) SARS-CoV-2 target nucleic acids are NOT DETECTED.  The SARS-CoV-2 RNA is generally detectable in upper and lower respiratory specimens during the acute phase of infection. Negative results do not preclude SARS-CoV-2 infection, do not rule out co-infections with other pathogens, and should not be used as the sole basis for treatment or other patient management decisions. Negative results must be combined with clinical observations, patient history, and epidemiological information. The expected result is Negative.  Fact Sheet for Patients: HairSlick.no  Fact Sheet for Healthcare Providers: quierodirigir.com  This test is not yet approved or cleared by the [place] FDA and  has been authorized for detection and/or diagnosis of SARS-CoV-2 by FDA under an Emergency Use Authorization (EUA). This EUA will remain  in effect (meaning this test can be used) for the duration of the COVID-19 declaration under Se ction 564(b)(1) of the Act, 21 U.S.C. section 360bbb-3(b)(1), unless the authorization is terminated or revoked sooner.  Performed at [facility], [address], [place], [place] [address]      Labs: BNP (last 3 results) No results for input(s): BNP in the last 8760 hours. Basic Metabolic Panel: Recent Labs  Lab 12/17/20 0310 12/22/20 1452 12/23/20 0230  NA 138 135 139   K 3.8 4.2 4.2  CL 103 105 104  CO2 GLUCOSE 96 99 96  BUN CREATININE 1.77* 1.60* 1.58*  CALCIUM 9.2 9.4 9.6   Liver Function Tests: No results for input(s): AST, ALT, ALKPHOS, BILITOT, PROT, ALBUMIN in the last 168 hours. No results for input(s): LIPASE, AMYLASE in the last 168 hours. No results for input(s): AMMONIA in the last 168 hours. CBC: Recent Labs  Lab 12/17/20 0310 12/22/20 1452 12/23/20 0230  WBC 6.0 7.1 6.7  NEUTROABS 3.6 4.9  --   HGB 13.3 15.0 14.6  HCT 39.9 45.2 43.3  MCV 87.3 87.6 87.7  PLT 161 203 178   Cardiac Enzymes: No results for input(s): CKTOTAL, CKMB, CKMBINDEX, TROPONINI in the last 168 hours. BNP: Invalid input(s): POCBNP CBG: No results for input(s): GLUCAP in the last 168 hours. D-Dimer No results for input(s): DDIMER in the last 72 hours. Hgb A1c No results for input(s): HGBA1C in the last 72 hours. Lipid Profile No results for input(s): CHOL, HDL, LDLCALC, TRIG, CHOLHDL, LDLDIRECT in the last 72 hours. Thyroid function studies No results for input(s): TSH, T4TOTAL, T3FREE, THYROIDAB in the last 72 hours.  Invalid input(s): FREET3 Anemia work up No results for input(s): VITAMINB12, FOLATE, FERRITIN, TIBC, IRON, RETICCTPCT in the last 72 hours. Urinalysis    Component Value Date/Time   COLORURINE YELLOW 12/14/2020 1921   APPEARANCEUR CLEAR 12/14/2020 1921  LABSPEC 1.011 12/14/2020 1921   PHURINE 7.0 12/14/2020 1921   GLUCOSEU NEGATIVE 12/14/2020 1921   HGBUR MODERATE (A) 12/14/2020 1921   BILIRUBINUR NEGATIVE 12/14/2020 1921   KETONESUR NEGATIVE 12/14/2020 1921   PROTEINUR 30 (A) 12/14/2020 1921   NITRITE NEGATIVE 12/14/2020 1921   LEUKOCYTESUR NEGATIVE 12/14/2020 1921   Sepsis Labs Invalid input(s): PROCALCITONIN,  WBC,  LACTICIDVEN Microbiology Recent Results (from the past 240 hour(s))  SARS CORONAVIRUS 2 (TAT 6-24 HRS) Nasopharyngeal Nasopharyngeal Swab     Status: None   Collection Time: 12/14/20   8:44 PM   Specimen: Nasopharyngeal Swab  Result Value Ref Range Status   SARS Coronavirus 2 NEGATIVE NEGATIVE Final    Comment: (NOTE) SARS-CoV-2 target nucleic acids are NOT DETECTED.  The SARS-CoV-2 RNA is generally detectable in upper and lower respiratory specimens during the acute phase of infection. Negative results do not preclude SARS-CoV-2 infection, do not rule out co-infections with other pathogens, and should not be used as the sole basis for treatment or other patient management decisions. Negative results must be combined with clinical observations, patient history, and epidemiological information. The expected result is Negative.  Fact Sheet for Patients: HairSlick.no  Fact Sheet for Healthcare Providers: quierodirigir.com  This test is not yet approved or cleared by the [place] FDA and  has been authorized for detection and/or diagnosis of SARS-CoV-2 by FDA under an Emergency Use Authorization (EUA). This EUA will remain  in effect (meaning this test can be used) for the duration of the COVID-19 declaration under Se ction 564(b)(1) of the Act, 21 U.S.C. section 360bbb-3(b)(1), unless the authorization is terminated or revoked sooner.  Performed at [facility], [address], [place], [place] [address]   SARS CORONAVIRUS 2 (TAT 6-24 HRS) Nasopharyngeal Nasopharyngeal Swab     Status: None   Collection Time: 12/23/20  2:35 AM   Specimen: Nasopharyngeal Swab  Result Value Ref Range Status   SARS Coronavirus 2 NEGATIVE NEGATIVE Final    Comment: (NOTE) SARS-CoV-2 target nucleic acids are NOT DETECTED.  The SARS-CoV-2 RNA is generally detectable in upper and lower respiratory specimens during the acute phase of infection. Negative results do not preclude SARS-CoV-2 infection, do not rule out co-infections with other pathogens, and should not be used as the sole basis for treatment or other  patient management decisions. Negative results must be combined with clinical observations, patient history, and epidemiological information. The expected result is Negative.  Fact Sheet for Patients: HairSlick.no  Fact Sheet for Healthcare Providers: quierodirigir.com  This test is not yet approved or cleared by the [place] FDA and  has been authorized for detection and/or diagnosis of SARS-CoV-2 by FDA under an Emergency Use Authorization (EUA). This EUA will remain  in effect (meaning this test can be used) for the duration of the COVID-19 declaration under Se ction 564(b)(1) of the Act, 21 U.S.C. section 360bbb-3(b)(1), unless the authorization is terminated or revoked sooner.  Performed at [facility], [address], [place], [place] [address]     Time coordinating discharge: Over 30 minutes  SIGNED:  Arnetha Courser, MD  [facility] 12/23/2020, 1:07 PM  If 7PM-7AM, please contact night-coverage [email or web address]  This record has been created using Conservation officer, historic buildings. Errors have been sought and corrected,but may not always be located. Such creation errors do not reflect on the standard of care.

## 2020-12-23 NOTE — ED Notes (Signed)
Pt to MRI

## 2020-12-23 NOTE — ED Notes (Signed)
Admitting paged for hypertension.

## 2020-12-23 NOTE — ED Notes (Signed)
Pt notifies this RN that he uses lidocaine patches for pain in his right shoulder and is requesting one. This RN notified Dr. Allena Katz and asked if pt could be prescribed one.

## 2020-12-23 NOTE — Evaluation (Signed)
Physical Therapy Evaluation Patient Details Name: Gary Mayer MRN: [id number] DOB: [birth year] Today's Date: 12/23/2020   History of Present Illness  Pt is a 65 y/o male admitted secondary to hypertensive emergency. Imaging showed expansion of R BG infarct. PMH includes CKD and recent CVA.  Clinical Impression  Pt admitted secondary to problem above with deficits below. Pt with elevated BP in standing (160/120) so further mobility deferred. Was able to stand and take side steps at EOB with supervision. No overt LOB noted. Pt was going to outpatient PT prior to admission. Recommend resumption at d/c. Will continue to follow acutely.     Follow Up Recommendations Home health PT;Supervision for mobility/OOB    Equipment Recommendations  None recommended by PT    Recommendations for Other Services       Precautions / Restrictions Precautions Precautions: Fall Restrictions Weight Bearing Restrictions: No      Mobility  Bed Mobility Overal bed mobility: Needs Assistance Bed Mobility: Supine to Sit;Sit to Supine     Supine to sit: Supervision Sit to supine: Min assist   General bed mobility comments: Min A for LE assist for return to supine.    Transfers Overall transfer level: Needs assistance Equipment used: None Transfers: Sit to/from Stand Sit to Stand: Supervision         General transfer comment: supervision for safety. was able to take side steps at EOB with supervision. Pt's BP at 160/120 in standing, so further mobility deferred. Upon return to supine, BP At 175/87.  Ambulation/Gait             General Gait Details: deferred secondary to high BP  Stairs            Wheelchair Mobility    Modified Rankin (Stroke Patients Only) Modified Rankin (Stroke Patients Only) Pre-Morbid Rankin Score: Moderate disability Modified Rankin: Moderately severe disability     Balance Overall balance assessment: Needs assistance Sitting-balance support: No  upper extremity supported;Feet supported Sitting balance-Leahy Scale: Good     Standing balance support: No upper extremity supported Standing balance-Leahy Scale: Fair                               Pertinent Vitals/Pain Pain Assessment: No/denies pain    Home Living Family/patient expects to be discharged to:: Private residence Living Arrangements: Other relatives (sister) Available Help at Discharge: Family;Available PRN/intermittently Type of Home: House Home Access: Level entry     Home Layout: Two level;1/2 bath on main level Home Equipment: None      Prior Function Level of Independence: Independent         Comments: Has been staying with sister and going to outpatient PT.     Hand Dominance        Extremity/Trunk Assessment   Upper Extremity Assessment Upper Extremity Assessment: Defer to OT evaluation    Lower Extremity Assessment Lower Extremity Assessment: LLE deficits/detail LLE Deficits / Details: Functional weakness from CVA    Cervical / Trunk Assessment Cervical / Trunk Assessment: Normal  Communication   Communication: Expressive difficulties (mild dysarthria)  Cognition Arousal/Alertness: Awake/alert Behavior During Therapy: WFL for tasks assessed/performed Overall Cognitive Status: No family/caregiver present to determine baseline cognitive functioning                                        General Comments  Exercises     Assessment/Plan    PT Assessment Patient needs continued PT services  PT Problem List Decreased strength;Decreased mobility;Decreased safety awareness;Decreased activity tolerance;Decreased cognition;Decreased balance;Decreased knowledge of use of DME       PT Treatment Interventions DME instruction;Therapeutic activities;Gait training;Therapeutic exercise;Patient/family education;Stair training;Balance training;Functional mobility training;Neuromuscular re-education    PT  Goals (Current goals can be found in the Care Plan section)  Acute Rehab PT Goals Patient Stated Goal: to be independent PT Goal Formulation: With patient Time For Goal Achievement: 01/06/21 Potential to Achieve Goals: Good    Frequency Min 3X/week   Barriers to discharge        Co-evaluation               AM-PAC PT "6 Clicks" Mobility  Outcome Measure Help needed turning from your back to your side while in a flat bed without using bedrails?: None Help needed moving from lying on your back to sitting on the side of a flat bed without using bedrails?: A Little Help needed moving to and from a bed to a chair (including a wheelchair)?: A Little Help needed standing up from a chair using your arms (e.g., wheelchair or bedside chair)?: A Little Help needed to walk in hospital room?: A Little Help needed climbing 3-5 steps with a railing? : A Little 6 Click Score: 19    End of Session Equipment Utilized During Treatment: Gait belt Activity Tolerance: Patient tolerated treatment well Patient left: in bed;with call bell/phone within reach (on stretcher in ED) Nurse Communication: Mobility status PT Visit Diagnosis: Unsteadiness on feet (R26.81);Muscle weakness (generalized) (M62.81)    Time: 4431-5400 PT Time Calculation (min) (ACUTE ONLY): 16 min   Charges:   PT Evaluation $PT Eval Moderate Complexity: 1 Mod          Farley Ly, PT, DPT  Acute Rehabilitation Services  Pager: [phone] Office: [phone]   [facility] Prom 12/23/2020, 1:35 PM

## 2020-12-23 NOTE — ED Notes (Signed)
Pt ambulating to bathroom.

## 2020-12-24 ENCOUNTER — Ambulatory Visit: Payer: Federal, State, Local not specified - PPO | Admitting: Physical Therapy

## 2020-12-30 ENCOUNTER — Ambulatory Visit: Payer: Federal, State, Local not specified - PPO | Admitting: Physical Therapy

## 2020-12-30 ENCOUNTER — Encounter: Payer: Self-pay | Admitting: Physical Therapy

## 2020-12-30 ENCOUNTER — Other Ambulatory Visit: Payer: Self-pay

## 2020-12-30 VITALS — BP 160/100

## 2020-12-30 DIAGNOSIS — R2689 Other abnormalities of gait and mobility: Secondary | ICD-10-CM

## 2020-12-30 NOTE — Therapy (Signed)
[facility] [address] [place], [place], [address] Phone: [phone]   Fax:  [phone]  Physical Therapy Evaluation- Arrived No Charge  Patient Details  Name: Gary Mayer MRN: [id number] Date of Birth: Jun 10, 1956 No data recorded  Encounter Date: 12/30/2020   PT End of Session - 12/30/20 1223    Visit Number 1   arrived no charge   Number of Visits 1    PT Start Time 0848    PT Stop Time 0928    PT Time Calculation (min) 40 min           Past Medical History:  Diagnosis Date  . CKD (chronic kidney disease) stage 3, GFR 30-59 ml/min (HCC)   . Diastolic CHF (HCC)   . Hypertension   . Kidney stone     History reviewed. No pertinent surgical history.  Vitals:   12/30/20 0904 12/30/20 0907  BP: (!) 180/105 (!) 160/100      Subjective Assessment - 12/30/20 0852    Patient is accompained by: Family member   pt's sister   Pertinent History HTN with poor med compliance, CHF, CKD 3.               Pt returns to PT eval for the 2nd time. The first time patient was sent to the ER due to hypertension and was hospitalized due to hypertensive urgency. CT head was negative for any acute findings, MRI brain with mild extension of prior stroke with no sign of any hemorrhage.  Pt's BP medications were adjusted. Pt's sister reports he has been taking his medication consistently and his BP has been better at home. Assessed BP at approx. 8:50 at 180/105 (see vitals above for measures)- pt reporting he is asymptomatic and took his meds approx. An hour ago. Pt's sister reports she normally checks it a couple hours after he takes it. Discussed importance of close follow-up with PCP before returning for eval to make sure BP is controlled. Pt is from the [place] area - will see his PCP there at the beginning of June and pt's sister looking into getting pt established to a PCP in the [place] area (as pt will need to  stay with sister down here). Provided pt's sister with number of [place] PCP office in the area that have openings in June. Educated on the signs/sx of a CVA and printed out handout and educated to continue to monitor BP and if pt demonstrates any signs/sx then to go to the ER immediately. Both verbalized understanding and appreciative of information provided. Discussed OT/ST needs and that pt will need to get a referral for these from his PCP.            Objective measurements completed on examination: See above findings.                             Patient will benefit from skilled therapeutic intervention in order to improve the following deficits and impairments:     Visit Diagnosis: Other abnormalities of gait and mobility     Problem List Patient Active Problem List   Diagnosis Date Noted  . Hypertensive urgency 12/22/2020  . Dysphagia 12/22/2020  . Acute ischemic stroke (HCC) 12/14/2020  . HTN (hypertension) 12/14/2020  . CKD (chronic kidney disease) stage 3, GFR 30-59 ml/min (HCC) 12/14/2020    Drake Leach, PT, DPT  12/30/2020, 12:30 PM  [place]  [facility] [address] [place], [place], [address] Phone: [phone]   Fax:  [phone]  Name: Gary Mayer MRN: [id number] Date of Birth: Feb 23, 1956

## 2020-12-30 NOTE — Patient Instructions (Signed)
Warning Signs of a Stroke A stroke is a medical emergency and should be treated right away--every second counts. A stroke is caused by a decrease or block in blood flow to the brain. When certain areas of the brain do not get enough oxygen, brain cells begin to die. A stroke can lead to brain damage and can sometimes be life-threatening. However, if a person gets medical treatment right away, he or she has a better chance of surviving and recovering from a stroke. It is very important to be able to recognize the symptoms of a stroke. Types of strokes There are two main types of strokes:  Ischemic stroke. This is the most common type. This stroke happens when a blood vessel that supplies blood to the brain is blocked.  Hemorrhagic stroke. This results from bleeding in the brain when a blood vessel leaks or bursts (ruptures). A transient ischemic attack (TIA) causes stroke-like symptoms that go away quickly. Unlike a stroke, a TIA does not cause permanent damage to the brain. However, the symptoms of a TIA are the same as a stroke. TIAs also require medical treatment right away. Having a TIA is a sign that you are at higher risk for a stroke. Warning signs of a stroke The symptoms of stroke may vary and will reflect the part of the brain that is involved. Symptoms usually happen suddenly. "BE FAST" is an easy way to remember the main warning signs of a stroke:  B - Balance. Signs are dizziness, sudden trouble walking, or loss of balance.  E - Eyes. Signs are trouble seeing or a sudden change in vision.  F - Face. Signs are sudden weakness or numbness of the face, or the face or eyelid drooping on one side.  A - Arms. Signs are weakness or numbness in an arm. This happens suddenly and usually on one side of the body.  S - Speech. Signs are sudden trouble speaking, slurred speech, or trouble understanding what people say.  T - Time. Time to call emergency services. Write down what time symptoms  started. Other signs of a stroke Some less common signs of a stroke include:  A sudden, severe headache with no known cause.  Nausea or vomiting.  Seizure. A stroke may be happening even if only one "BE FAST" symptom is present. These symptoms may represent a serious problem that is an emergency. Do not wait to see if the symptoms will go away. Get medical help right away. Call your local emergency services (911 in the [place] Do not drive yourself to the hospital.   Summary  A stroke is a medical emergency and should be treated right away--every second counts.  "BE FAST" is an easy way to remember the main warning signs of a stroke.  Call your local emergency services right away if you or someone else has any stroke symptoms, even if the symptoms go away.  Make note of what time the first symptoms appeared. Emergency responders or emergency room staff will need to know this information.  Do not wait to see if symptoms will go away. Call 911 even if only one of the "BE FAST" symptoms appears. This information is not intended to replace advice given to you by your health care provider. Make sure you discuss any questions you have with your health care provider. Document Revised: 03/01/2020 Document Reviewed: 03/01/2020 Elsevier Patient Education  2021 [employer]  

## 2021-01-03 ENCOUNTER — Other Ambulatory Visit: Payer: Self-pay | Admitting: *Deleted

## 2021-01-03 NOTE — Patient Outreach (Signed)
[facility]) Care Management  01/03/2021  Gary Mayer 1956/01/25 [id number]   RED ON EMMI ALERT - Stroke Day # 13 Date: 5/20 Red Alert Reason: Not had follow up appointment   Outreach attempt #1, successful.  Identity verified.  This care manager introduced self and stated purpose of call.  [facility] care management services explained.  Member state he is not able to talk well, sister is helping manage condition and discharge.  Grants permission to speak with sister [name].  [name] report member has not been able to see a physician due to him being away from his home town in [place].  He has an appointment with a cardiologist on 6/2, whom member also state is his PCP in [place].  Sister voice frustration regarding member not caring for himself well back in his home, may be moving to [place] so she can care for him.    He had OT and ST ordered however they have not been able to complete a full assessment due to member's uncontrolled hypertension.  She report his blood pressure is better with medication changes, ranges 160/80's.  Per chart, it has been recommended for member to see a PCP to manage HTN and place new referral therapy.  A list of providers were giving to member and sister, she will call office to attempt to schedule as soon as possible.    Plan: RN CM will follow up with sister/patient within the next 2 weeks to confirm plan of care (stay in [place] or move to [place]).  Will help find local PCP if one is not secured.  Kemper Durie, [place], MSN [facility] Care Management  [facility] Dt Manager [phone]

## 2021-01-05 ENCOUNTER — Other Ambulatory Visit (HOSPITAL_COMMUNITY): Payer: Self-pay

## 2021-01-14 ENCOUNTER — Other Ambulatory Visit: Payer: Self-pay | Admitting: *Deleted

## 2021-01-14 NOTE — Patient Outreach (Signed)
[facility]) Care Management  01/14/2021  Lamari Beckles 1955-12-10 [id number]   Outgoing call placed to member and sister to follow up on stroke recovery, no answer for either.  HIPAA compliant voice messages left, will follow up within the next 3-4 business days.  Kemper Durie, [place], MSN [facility] Care Management  [facility] Manager [phone]

## 2021-01-19 ENCOUNTER — Other Ambulatory Visit: Payer: Self-pay | Admitting: *Deleted

## 2021-01-19 NOTE — Patient Outreach (Signed)
[facility] Care Management  01/19/2021  Gary Mayer [birth year] [id number]   Outreach attempt #2 to member's sister, successful.  State member was seen back in [place] by his PCP, has decided to stay there for ongoing care.  He is being cared for by another sister in that area.  Will close case at this time as member is not eligible for services and all needs are met.  [name], [place], MSN [place] [phone]

## 2021-08-24 ENCOUNTER — Other Ambulatory Visit (HOSPITAL_COMMUNITY): Payer: Self-pay
